# Patient Record
Sex: Male | Born: 1980 | Race: White | Hispanic: No | Marital: Single | Smoking: Never smoker
Health system: Southern US, Community
[De-identification: ages and names within clinical notes are randomized; demographics above are authoritative.]

## PROBLEM LIST (undated history)

## (undated) DIAGNOSIS — G932 Benign intracranial hypertension: Secondary | ICD-10-CM

## (undated) DIAGNOSIS — F419 Anxiety disorder, unspecified: Secondary | ICD-10-CM

## (undated) DIAGNOSIS — F32A Depression, unspecified: Secondary | ICD-10-CM

## (undated) DIAGNOSIS — J45909 Unspecified asthma, uncomplicated: Secondary | ICD-10-CM

## (undated) HISTORY — DX: Anxiety disorder, unspecified: F41.9

## (undated) HISTORY — DX: Depression, unspecified: F32.A

## (undated) HISTORY — DX: Unspecified asthma, uncomplicated: J45.909

## (undated) HISTORY — DX: Benign intracranial hypertension: G93.2

---

## 2009-02-08 DIAGNOSIS — N200 Calculus of kidney: Secondary | ICD-10-CM | POA: Insufficient documentation

## 2009-02-08 DIAGNOSIS — G932 Benign intracranial hypertension: Secondary | ICD-10-CM | POA: Insufficient documentation

## 2009-08-09 DIAGNOSIS — J45909 Unspecified asthma, uncomplicated: Secondary | ICD-10-CM | POA: Insufficient documentation

## 2012-09-01 DIAGNOSIS — J309 Allergic rhinitis, unspecified: Secondary | ICD-10-CM | POA: Insufficient documentation

## 2014-12-24 DIAGNOSIS — J3089 Other allergic rhinitis: Secondary | ICD-10-CM

## 2014-12-24 DIAGNOSIS — J4540 Moderate persistent asthma, uncomplicated: Secondary | ICD-10-CM | POA: Insufficient documentation

## 2015-01-13 ENCOUNTER — Ambulatory Visit (INDEPENDENT_AMBULATORY_CARE_PROVIDER_SITE_OTHER): Payer: BC Managed Care – PPO | Admitting: Neurology

## 2015-01-13 DIAGNOSIS — J3089 Other allergic rhinitis: Secondary | ICD-10-CM | POA: Diagnosis not present

## 2015-01-18 DIAGNOSIS — J301 Allergic rhinitis due to pollen: Secondary | ICD-10-CM | POA: Diagnosis not present

## 2015-02-03 ENCOUNTER — Ambulatory Visit (INDEPENDENT_AMBULATORY_CARE_PROVIDER_SITE_OTHER): Payer: BC Managed Care – PPO | Admitting: Neurology

## 2015-02-03 DIAGNOSIS — J309 Allergic rhinitis, unspecified: Secondary | ICD-10-CM | POA: Diagnosis not present

## 2015-02-24 ENCOUNTER — Ambulatory Visit (INDEPENDENT_AMBULATORY_CARE_PROVIDER_SITE_OTHER): Payer: BC Managed Care – PPO | Admitting: *Deleted

## 2015-02-24 DIAGNOSIS — J309 Allergic rhinitis, unspecified: Secondary | ICD-10-CM | POA: Diagnosis not present

## 2015-03-17 ENCOUNTER — Ambulatory Visit (INDEPENDENT_AMBULATORY_CARE_PROVIDER_SITE_OTHER): Payer: BC Managed Care – PPO | Admitting: *Deleted

## 2015-03-17 DIAGNOSIS — J309 Allergic rhinitis, unspecified: Secondary | ICD-10-CM | POA: Diagnosis not present

## 2015-04-21 ENCOUNTER — Ambulatory Visit (INDEPENDENT_AMBULATORY_CARE_PROVIDER_SITE_OTHER): Payer: BC Managed Care – PPO | Admitting: Neurology

## 2015-04-21 DIAGNOSIS — J309 Allergic rhinitis, unspecified: Secondary | ICD-10-CM | POA: Diagnosis not present

## 2015-05-26 ENCOUNTER — Ambulatory Visit (INDEPENDENT_AMBULATORY_CARE_PROVIDER_SITE_OTHER): Payer: BC Managed Care – PPO | Admitting: Neurology

## 2015-05-26 DIAGNOSIS — J309 Allergic rhinitis, unspecified: Secondary | ICD-10-CM | POA: Diagnosis not present

## 2015-06-19 ENCOUNTER — Ambulatory Visit (INDEPENDENT_AMBULATORY_CARE_PROVIDER_SITE_OTHER): Payer: BC Managed Care – PPO

## 2015-06-19 DIAGNOSIS — J309 Allergic rhinitis, unspecified: Secondary | ICD-10-CM

## 2015-07-14 ENCOUNTER — Ambulatory Visit (INDEPENDENT_AMBULATORY_CARE_PROVIDER_SITE_OTHER): Payer: BC Managed Care – PPO | Admitting: *Deleted

## 2015-07-14 DIAGNOSIS — J309 Allergic rhinitis, unspecified: Secondary | ICD-10-CM | POA: Diagnosis not present

## 2015-07-21 ENCOUNTER — Ambulatory Visit (INDEPENDENT_AMBULATORY_CARE_PROVIDER_SITE_OTHER): Payer: BC Managed Care – PPO | Admitting: *Deleted

## 2015-07-21 DIAGNOSIS — J309 Allergic rhinitis, unspecified: Secondary | ICD-10-CM | POA: Diagnosis not present

## 2015-08-01 ENCOUNTER — Ambulatory Visit (INDEPENDENT_AMBULATORY_CARE_PROVIDER_SITE_OTHER): Payer: BC Managed Care – PPO | Admitting: *Deleted

## 2015-08-01 DIAGNOSIS — J309 Allergic rhinitis, unspecified: Secondary | ICD-10-CM

## 2015-08-10 ENCOUNTER — Ambulatory Visit (INDEPENDENT_AMBULATORY_CARE_PROVIDER_SITE_OTHER): Payer: BC Managed Care – PPO

## 2015-08-10 DIAGNOSIS — J309 Allergic rhinitis, unspecified: Secondary | ICD-10-CM

## 2015-08-18 ENCOUNTER — Ambulatory Visit (INDEPENDENT_AMBULATORY_CARE_PROVIDER_SITE_OTHER): Payer: BC Managed Care – PPO | Admitting: *Deleted

## 2015-08-18 DIAGNOSIS — J309 Allergic rhinitis, unspecified: Secondary | ICD-10-CM | POA: Diagnosis not present

## 2015-09-15 ENCOUNTER — Ambulatory Visit (INDEPENDENT_AMBULATORY_CARE_PROVIDER_SITE_OTHER): Payer: BC Managed Care – PPO | Admitting: *Deleted

## 2015-09-15 DIAGNOSIS — J309 Allergic rhinitis, unspecified: Secondary | ICD-10-CM

## 2015-10-05 ENCOUNTER — Ambulatory Visit (INDEPENDENT_AMBULATORY_CARE_PROVIDER_SITE_OTHER): Payer: BC Managed Care – PPO

## 2015-10-05 DIAGNOSIS — J309 Allergic rhinitis, unspecified: Secondary | ICD-10-CM | POA: Diagnosis not present

## 2015-10-24 DIAGNOSIS — J301 Allergic rhinitis due to pollen: Secondary | ICD-10-CM | POA: Diagnosis not present

## 2015-10-27 ENCOUNTER — Ambulatory Visit (INDEPENDENT_AMBULATORY_CARE_PROVIDER_SITE_OTHER): Payer: BC Managed Care – PPO | Admitting: *Deleted

## 2015-10-27 DIAGNOSIS — J309 Allergic rhinitis, unspecified: Secondary | ICD-10-CM

## 2015-11-24 ENCOUNTER — Ambulatory Visit (INDEPENDENT_AMBULATORY_CARE_PROVIDER_SITE_OTHER): Payer: BC Managed Care – PPO | Admitting: *Deleted

## 2015-11-24 DIAGNOSIS — J309 Allergic rhinitis, unspecified: Secondary | ICD-10-CM | POA: Diagnosis not present

## 2015-12-21 ENCOUNTER — Other Ambulatory Visit: Payer: Self-pay | Admitting: Allergy and Immunology

## 2015-12-22 ENCOUNTER — Ambulatory Visit (INDEPENDENT_AMBULATORY_CARE_PROVIDER_SITE_OTHER): Payer: BC Managed Care – PPO

## 2015-12-22 DIAGNOSIS — J309 Allergic rhinitis, unspecified: Secondary | ICD-10-CM | POA: Diagnosis not present

## 2016-01-05 ENCOUNTER — Other Ambulatory Visit: Payer: Self-pay | Admitting: Allergy and Immunology

## 2016-01-12 ENCOUNTER — Ambulatory Visit (INDEPENDENT_AMBULATORY_CARE_PROVIDER_SITE_OTHER): Payer: BC Managed Care – PPO

## 2016-01-12 DIAGNOSIS — J309 Allergic rhinitis, unspecified: Secondary | ICD-10-CM | POA: Diagnosis not present

## 2016-01-17 ENCOUNTER — Other Ambulatory Visit: Payer: Self-pay | Admitting: Allergy and Immunology

## 2016-01-17 ENCOUNTER — Other Ambulatory Visit: Payer: Self-pay

## 2016-01-17 ENCOUNTER — Telehealth: Payer: Self-pay | Admitting: Allergy and Immunology

## 2016-01-17 MED ORDER — FLUTICASONE-SALMETEROL 100-50 MCG/DOSE IN AEPB: INHALATION_SPRAY | RESPIRATORY_TRACT | 0 refills | Status: DC

## 2016-01-17 NOTE — Telephone Encounter (Signed)
Pt called and made appointment on 01/23/2016 at 5:15 but he needs his advair refilled. [facility] [address] [phone].

## 2016-01-17 NOTE — Telephone Encounter (Signed)
Sent in 1 refill 

## 2016-01-19 ENCOUNTER — Ambulatory Visit (INDEPENDENT_AMBULATORY_CARE_PROVIDER_SITE_OTHER): Payer: BC Managed Care – PPO | Admitting: *Deleted

## 2016-01-19 DIAGNOSIS — J3089 Other allergic rhinitis: Secondary | ICD-10-CM

## 2016-01-23 ENCOUNTER — Encounter: Payer: Self-pay | Admitting: Allergy and Immunology

## 2016-01-23 ENCOUNTER — Encounter (INDEPENDENT_AMBULATORY_CARE_PROVIDER_SITE_OTHER): Payer: Self-pay

## 2016-01-23 ENCOUNTER — Ambulatory Visit (INDEPENDENT_AMBULATORY_CARE_PROVIDER_SITE_OTHER): Payer: BC Managed Care – PPO | Admitting: Allergy and Immunology

## 2016-01-23 VITALS — BP 122/72 | HR 80 | Resp 18 | Ht 63.5 in | Wt 165.6 lb

## 2016-01-23 DIAGNOSIS — H1010 Acute atopic conjunctivitis, unspecified eye: Secondary | ICD-10-CM

## 2016-01-23 DIAGNOSIS — J4540 Moderate persistent asthma, uncomplicated: Secondary | ICD-10-CM

## 2016-01-23 DIAGNOSIS — J309 Allergic rhinitis, unspecified: Secondary | ICD-10-CM

## 2016-01-23 MED ORDER — FLUTICASONE-SALMETEROL 100-50 MCG/DOSE IN AEPB: INHALATION_SPRAY | RESPIRATORY_TRACT | 5 refills | Status: DC

## 2016-01-23 MED ORDER — EPINEPHRINE 0.3 MG/0.3ML IJ SOAJ: 0.3000 mg | Freq: Once | INTRAMUSCULAR | 0 refills | Status: AC

## 2016-01-23 NOTE — Progress Notes (Signed)
Follow-up Note  Referring Provider: Deatra JamesSun, Vyvyan, MD Primary Provider: Leanor RubensteinSUN,VYVYAN Y, MD Date of Office Visit: 01/23/2016  Subjective:   David May (DOB: 08/03/1980) is a 35 y.o. male who [facility] in re-evaluation of the following:  HPI: David May returns to this clinic in evaluation of his asthma and allergic rhinoconjunctivitis treated with immunotherapy. It is been over 1 year since I've seen him in this clinic.  He appears to have done very well over the course of the past year and has not required either a systemic steroid or an antibiotic to treat any issue with his respiratory tract. He can exercise without any difficulty and rarely uses a short acting bronchodilator.  His immunotherapy has been going quite well presently at every 3 weeks. He has not had an adverse effects secondary to this form of treatment. This form of treatment has definitely given rise to very good control of all of his atopic respiratory symptoms.  David May has already receiving the flu vaccine this year    Medication List      acetaZOLAMIDE 250 MG tablet Commonly known as:  DIAMOX Take 250 mg by mouth daily.   acyclovir 200 MG capsule Commonly known as:  ZOVIRAX Take 200 mg by mouth as needed.   cetirizine 10 MG tablet Commonly known as:  ZYRTEC Take 10 mg by mouth as needed for allergies.   EPIPEN 2-PAK 0.3 mg/0.3 mL Soaj injection Generic drug:  EPINEPHrine USE AS DIRECTED FOR LIFE THREATENING ALLERGIC REACTIONS...DISPENSE 2 2PAKS 4 TOTAL PENS   FLONASE 50 MCG/ACT nasal spray Generic drug:  fluticasone Place 2 sprays into both nostrils daily.   Fluticasone-Salmeterol 100-50 MCG/DOSE Aepb Commonly known as:  ADVAIR DISKUS USE ONE INHALATION EVERY 12 HOURS TO PREVENT COUGH OR WHEEZE.   lansoprazole 30 MG capsule Commonly known as:  PREVACID Take 30 mg by mouth daily.   PROAIR HFA 108 (90 Base) MCG/ACT inhaler Generic drug:   albuterol Inhale 2 puffs into the lungs every 4 (four) hours as needed for wheezing or shortness of breath.       Past Medical History:  Diagnosis Date  . Asthma   . Pseudotumor cerebri     History reviewed. No pertinent surgical history.  Allergies  Allergen Reactions  . Codeine Itching    Review of systems negative except as noted in HPI / PMHx or noted below:  Review of Systems  Constitutional: Negative.   HENT: Negative.   Eyes: Negative.   Respiratory: Negative.   Cardiovascular: Negative.   Gastrointestinal: Negative.   Genitourinary: Negative.   Musculoskeletal: Negative.   Skin: Negative.   Neurological: Negative.   Endo/Heme/Allergies: Negative.   Psychiatric/Behavioral: Negative.      Objective:   Vitals:   01/23/16 1719  BP: 122/72  Pulse: 80  Resp: 18   Height: 5' 3.5" (161.3 cm)  Weight: 165 lb 9.6 oz (75.1 kg)   Physical Exam  Constitutional: He is well-developed, well-nourished, and in no distress.  HENT:  Head: Normocephalic.  Right Ear: Tympanic membrane, external ear and ear canal normal.  Left Ear: Tympanic membrane, external ear and ear canal normal.  Nose: Nose normal. No mucosal edema or rhinorrhea.  Mouth/Throat: Uvula is midline, oropharynx is clear and moist and mucous membranes are normal. No oropharyngeal exudate.  Eyes: Conjunctivae are normal.  Neck: Trachea normal. No tracheal tenderness present. No tracheal deviation present. No thyromegaly present.  Cardiovascular: Normal rate, regular rhythm, S1  normal, S2 normal and normal heart sounds.   No murmur heard. Pulmonary/Chest: Breath sounds normal. No stridor. No respiratory distress. He has no wheezes. He has no rales.  Musculoskeletal: He exhibits no edema.  Lymphadenopathy:       Head (right side): No tonsillar adenopathy present.       Head (left side): No tonsillar adenopathy present.    He has no cervical adenopathy.  Neurological: He is alert. Gait normal.  Skin:  No rash noted. He is not diaphoretic. No erythema. Nails show no clubbing.  Psychiatric: Mood and affect normal.    Diagnostics:    Spirometry was performed and demonstrated an FEV1 of 3.45 at 111 % of predicted.  The patient had an Asthma Control Test with the following results:  .    Assessment and Plan:   1. Asthma, moderate persistent, well-controlled   2. Allergic rhinoconjunctivitis     1. Continue immunotherapy and EpiPen  2. Use Advair 100 one inhalation one or 2 times per day depending on disease activity  3. Use Flonase one spray each nostril one or 2 times per day depending on disease activity  4. Continue antihistamine if needed  5. Continue ProAir HFA if needed  6. Return to clinic in 1 year or earlier if problem  We will now see if Shae can decrease his medical therapy by having him use 50% of his Advair and Flonase from his previous dose. He will continue on immunotherapy and we will see him back in this clinic in 1 year or earlier if there is a problem.  Laurette Schimke, MD [facility]

## 2016-01-23 NOTE — Patient Instructions (Addendum)
  1. Continue immunotherapy and EpiPen  2. Use Advair 100 one inhalation one or 2 times per day depending on disease activity  3. Use Flonase one spray each nostril one or 2 times per day depending on disease activity  4. Continue antihistamine if needed  5. Continue ProAir HFA if needed  6. Return to clinic in 1 year or earlier if problem

## 2016-01-30 DIAGNOSIS — M222X1 Patellofemoral disorders, right knee: Secondary | ICD-10-CM | POA: Insufficient documentation

## 2016-02-02 ENCOUNTER — Ambulatory Visit (INDEPENDENT_AMBULATORY_CARE_PROVIDER_SITE_OTHER): Payer: BC Managed Care – PPO

## 2016-02-02 DIAGNOSIS — J309 Allergic rhinitis, unspecified: Secondary | ICD-10-CM | POA: Diagnosis not present

## 2016-02-09 ENCOUNTER — Ambulatory Visit (INDEPENDENT_AMBULATORY_CARE_PROVIDER_SITE_OTHER): Payer: BC Managed Care – PPO

## 2016-02-09 DIAGNOSIS — J309 Allergic rhinitis, unspecified: Secondary | ICD-10-CM | POA: Diagnosis not present

## 2016-02-16 ENCOUNTER — Ambulatory Visit (INDEPENDENT_AMBULATORY_CARE_PROVIDER_SITE_OTHER): Payer: BC Managed Care – PPO

## 2016-02-16 DIAGNOSIS — J309 Allergic rhinitis, unspecified: Secondary | ICD-10-CM

## 2016-02-23 ENCOUNTER — Ambulatory Visit: Payer: Self-pay

## 2016-02-23 DIAGNOSIS — J309 Allergic rhinitis, unspecified: Secondary | ICD-10-CM

## 2016-02-27 ENCOUNTER — Other Ambulatory Visit: Payer: Self-pay | Admitting: Allergy and Immunology

## 2016-03-15 ENCOUNTER — Ambulatory Visit (INDEPENDENT_AMBULATORY_CARE_PROVIDER_SITE_OTHER): Payer: BC Managed Care – PPO | Admitting: *Deleted

## 2016-03-15 DIAGNOSIS — J309 Allergic rhinitis, unspecified: Secondary | ICD-10-CM | POA: Diagnosis not present

## 2016-04-19 ENCOUNTER — Ambulatory Visit (INDEPENDENT_AMBULATORY_CARE_PROVIDER_SITE_OTHER): Payer: BC Managed Care – PPO

## 2016-04-19 DIAGNOSIS — J309 Allergic rhinitis, unspecified: Secondary | ICD-10-CM

## 2016-05-09 NOTE — Addendum Note (Signed)
Addended by: Rosana FretWHITAKER, Rosabelle Jupin L on: 05/09/2016 11:00 AM   Modules accepted: Orders

## 2016-05-17 ENCOUNTER — Ambulatory Visit (INDEPENDENT_AMBULATORY_CARE_PROVIDER_SITE_OTHER): Payer: BC Managed Care – PPO

## 2016-05-17 DIAGNOSIS — J309 Allergic rhinitis, unspecified: Secondary | ICD-10-CM

## 2016-06-20 DIAGNOSIS — J301 Allergic rhinitis due to pollen: Secondary | ICD-10-CM | POA: Diagnosis not present

## 2016-06-28 ENCOUNTER — Ambulatory Visit (INDEPENDENT_AMBULATORY_CARE_PROVIDER_SITE_OTHER): Payer: BC Managed Care – PPO | Admitting: *Deleted

## 2016-06-28 DIAGNOSIS — J309 Allergic rhinitis, unspecified: Secondary | ICD-10-CM | POA: Diagnosis not present

## 2016-08-23 ENCOUNTER — Ambulatory Visit (INDEPENDENT_AMBULATORY_CARE_PROVIDER_SITE_OTHER): Payer: BC Managed Care – PPO | Admitting: *Deleted

## 2016-08-23 DIAGNOSIS — J309 Allergic rhinitis, unspecified: Secondary | ICD-10-CM

## 2016-08-30 ENCOUNTER — Ambulatory Visit (INDEPENDENT_AMBULATORY_CARE_PROVIDER_SITE_OTHER): Payer: BC Managed Care – PPO

## 2016-08-30 DIAGNOSIS — J309 Allergic rhinitis, unspecified: Secondary | ICD-10-CM | POA: Diagnosis not present

## 2016-09-06 ENCOUNTER — Ambulatory Visit (INDEPENDENT_AMBULATORY_CARE_PROVIDER_SITE_OTHER): Payer: BC Managed Care – PPO

## 2016-09-06 DIAGNOSIS — J309 Allergic rhinitis, unspecified: Secondary | ICD-10-CM

## 2016-09-20 ENCOUNTER — Ambulatory Visit (INDEPENDENT_AMBULATORY_CARE_PROVIDER_SITE_OTHER): Payer: BC Managed Care – PPO | Admitting: *Deleted

## 2016-09-20 DIAGNOSIS — J309 Allergic rhinitis, unspecified: Secondary | ICD-10-CM

## 2016-09-27 ENCOUNTER — Ambulatory Visit (INDEPENDENT_AMBULATORY_CARE_PROVIDER_SITE_OTHER): Payer: BC Managed Care – PPO

## 2016-09-27 DIAGNOSIS — J309 Allergic rhinitis, unspecified: Secondary | ICD-10-CM | POA: Diagnosis not present

## 2016-10-04 ENCOUNTER — Ambulatory Visit (INDEPENDENT_AMBULATORY_CARE_PROVIDER_SITE_OTHER): Payer: BC Managed Care – PPO

## 2016-10-04 DIAGNOSIS — J309 Allergic rhinitis, unspecified: Secondary | ICD-10-CM

## 2016-10-10 ENCOUNTER — Ambulatory Visit (INDEPENDENT_AMBULATORY_CARE_PROVIDER_SITE_OTHER): Payer: BC Managed Care – PPO | Admitting: *Deleted

## 2016-10-10 DIAGNOSIS — J309 Allergic rhinitis, unspecified: Secondary | ICD-10-CM

## 2016-10-24 ENCOUNTER — Ambulatory Visit (INDEPENDENT_AMBULATORY_CARE_PROVIDER_SITE_OTHER): Payer: BC Managed Care – PPO | Admitting: *Deleted

## 2016-10-24 DIAGNOSIS — J309 Allergic rhinitis, unspecified: Secondary | ICD-10-CM | POA: Diagnosis not present

## 2016-11-15 ENCOUNTER — Ambulatory Visit (INDEPENDENT_AMBULATORY_CARE_PROVIDER_SITE_OTHER): Payer: BC Managed Care – PPO

## 2016-11-15 DIAGNOSIS — J309 Allergic rhinitis, unspecified: Secondary | ICD-10-CM

## 2016-12-13 ENCOUNTER — Ambulatory Visit (INDEPENDENT_AMBULATORY_CARE_PROVIDER_SITE_OTHER): Payer: BC Managed Care – PPO

## 2016-12-13 DIAGNOSIS — J309 Allergic rhinitis, unspecified: Secondary | ICD-10-CM

## 2017-01-03 ENCOUNTER — Ambulatory Visit (INDEPENDENT_AMBULATORY_CARE_PROVIDER_SITE_OTHER): Payer: BC Managed Care – PPO

## 2017-01-03 DIAGNOSIS — J309 Allergic rhinitis, unspecified: Secondary | ICD-10-CM

## 2017-01-17 DIAGNOSIS — J3089 Other allergic rhinitis: Secondary | ICD-10-CM | POA: Diagnosis not present

## 2017-01-28 ENCOUNTER — Ambulatory Visit (INDEPENDENT_AMBULATORY_CARE_PROVIDER_SITE_OTHER): Payer: BC Managed Care – PPO | Admitting: *Deleted

## 2017-01-28 DIAGNOSIS — J309 Allergic rhinitis, unspecified: Secondary | ICD-10-CM

## 2017-02-21 ENCOUNTER — Ambulatory Visit (INDEPENDENT_AMBULATORY_CARE_PROVIDER_SITE_OTHER): Payer: BC Managed Care – PPO

## 2017-02-21 DIAGNOSIS — J309 Allergic rhinitis, unspecified: Secondary | ICD-10-CM | POA: Diagnosis not present

## 2017-03-14 ENCOUNTER — Ambulatory Visit (INDEPENDENT_AMBULATORY_CARE_PROVIDER_SITE_OTHER): Payer: BC Managed Care – PPO

## 2017-03-14 DIAGNOSIS — J309 Allergic rhinitis, unspecified: Secondary | ICD-10-CM

## 2017-03-15 ENCOUNTER — Other Ambulatory Visit: Payer: Self-pay | Admitting: Allergy and Immunology

## 2017-03-18 ENCOUNTER — Other Ambulatory Visit: Payer: Self-pay

## 2017-03-18 ENCOUNTER — Encounter: Payer: Self-pay | Admitting: Allergy and Immunology

## 2017-03-18 ENCOUNTER — Ambulatory Visit: Payer: BC Managed Care – PPO | Admitting: Allergy and Immunology

## 2017-03-18 VITALS — BP 130/88 | HR 72 | Resp 20

## 2017-03-18 DIAGNOSIS — J4530 Mild persistent asthma, uncomplicated: Secondary | ICD-10-CM | POA: Diagnosis not present

## 2017-03-18 DIAGNOSIS — J3089 Other allergic rhinitis: Secondary | ICD-10-CM | POA: Diagnosis not present

## 2017-03-18 MED ORDER — FLUTICASONE-SALMETEROL 100-50 MCG/DOSE IN AEPB: INHALATION_SPRAY | RESPIRATORY_TRACT | 5 refills | Status: DC

## 2017-03-18 MED ORDER — EPINEPHRINE 0.3 MG/0.3ML IJ SOAJ: 0.3000 mg | Freq: Once | INTRAMUSCULAR | 3 refills | Status: AC

## 2017-03-18 MED ORDER — ALBUTEROL SULFATE HFA 108 (90 BASE) MCG/ACT IN AERS: 2.0000 | INHALATION_SPRAY | RESPIRATORY_TRACT | 3 refills | Status: DC | PRN

## 2017-03-18 NOTE — Progress Notes (Signed)
Follow-up Note  Referring Provider: Deatra JamesSun, Vyvyan, MD Primary Provider: Deatra JamesSun, Vyvyan, MD Date of Office Visit: 03/18/2017  Subjective:   David May (DOB: 06/04/1980) is a 36 y.o. male who [facility] 03[facility]/2017 in re-evaluation of the following:  HPI: David May presents to this clinic in reevaluation of his asthma and allergic rhinoconjunctivitis.  He was last seen in this clinic 23 January 2016.  He is utilizing a course of immunotherapy presently at every 3 weeks and has done very well with this form of treatment.  He has not had an adverse effect secondary to this treatment.  He has had no issues with asthma and presently does has not used Advair the past week.  He was using 1 inhalation 1 time per day prior to that point.  Rarely does he use a short acting bronchodilator.  He has had no problems with his nose and intermittently uses Flonase.  He has not required either a systemic steroid or an antibiotic to treat any type of respiratory tract issue.  He did receive the flu vaccine this year.  Allergies as of 03/18/2017      Reactions   Codeine Itching   Hydrocodone-acetaminophen Rash      Medication List      acyclovir 200 MG capsule Commonly known as:  ZOVIRAX Take 200 mg by mouth as needed.   cetirizine 10 MG tablet Commonly known as:  ZYRTEC Take 10 mg by mouth as needed for allergies.   fluticasone 50 MCG/ACT nasal spray Commonly known as:  FLONASE USE 1-2 SPRAYS EACH NOSTRIL ONCE DAILY FOR STUFFY NOSE OR DRAINAGE   Fluticasone-Salmeterol 100-50 MCG/DOSE Aepb Commonly known as:  ADVAIR DISKUS USE ONE INHALATION EVERY 12 HOURS TO PREVENT COUGH OR WHEEZE.   lansoprazole 30 MG capsule Commonly known as:  PREVACID Take 30 mg by mouth daily.   PROAIR HFA 108 (90 Base) MCG/ACT inhaler Generic drug:  albuterol Inhale 2 puffs into the lungs every 4 (four) hours as needed for wheezing or shortness of breath.       Past Medical  History:  Diagnosis Date  . Asthma   . Pseudotumor cerebri     History reviewed. No pertinent surgical history.  Review of systems negative except as noted in HPI / PMHx or noted below:  Review of Systems  Constitutional: Negative.   HENT: Negative.   Eyes: Negative.   Respiratory: Negative.   Cardiovascular: Negative.   Gastrointestinal: Negative.   Genitourinary: Negative.   Musculoskeletal: Negative.   Skin: Negative.   Neurological: Negative.   Endo/Heme/Allergies: Negative.   Psychiatric/Behavioral: Negative.      Objective:   Vitals:   03/18/17 1605  BP: 130/88  Pulse: 72  Resp: 20          Physical Exam  Constitutional: He is well-developed, well-nourished, and in no distress.  HENT:  Head: Normocephalic.  Right Ear: Tympanic membrane, external ear and ear canal normal.  Left Ear: Tympanic membrane, external ear and ear canal normal.  Nose: Nose normal. No mucosal edema or rhinorrhea.  Mouth/Throat: Uvula is midline, oropharynx is clear and moist and mucous membranes are normal. No oropharyngeal exudate.  Eyes: Conjunctivae are normal.  Neck: Trachea normal. No tracheal tenderness present. No tracheal deviation present. No thyromegaly present.  Cardiovascular: Normal rate, regular rhythm, S1 normal, S2 normal and normal heart sounds.  No murmur heard. Pulmonary/Chest: Breath sounds normal. No stridor. No respiratory distress. He has no wheezes.  He has no rales.  Musculoskeletal: He exhibits no edema.  Lymphadenopathy:       Head (right side): No tonsillar adenopathy present.       Head (left side): No tonsillar adenopathy present.    He has no cervical adenopathy.  Neurological: He is alert. Gait normal.  Skin: No rash noted. He is not diaphoretic. No erythema. Nails show no clubbing.  Psychiatric: Mood and affect normal.    Diagnostics:    Spirometry was performed and demonstrated an FEV1 of 3.64 at 101 % of predicted.  The patient had an Asthma  Control Test with the following results: ACT Total Score: 25.    Assessment and Plan:   1. Asthma, well controlled, mild persistent   2. Other allergic rhinitis     1. Continue immunotherapy and EpiPen  2. Discontinue daily use of Advair 100: can restart at 1 inhalation two times a day if increased asthma activity.  3. Use Flonase one spray each nostril 1 or 2 times per day depending on disease activity  4. Continue antihistamine if needed  5. Continue ProAir HFA if needed  6. Return to clinic in 1 year or earlier if problem  David May appears to be doing quite well on his current plan which includes immunotherapy and some occasional anti-inflammatory agents for his respiratory tract.  He will continue on this form of treatment and I will see him back in this clinic in 1 year or earlier if there is a problem.  Laurette SchimkeEric Pelagia Iacobucci, MD Allergy / Immunology [facility]

## 2017-03-18 NOTE — Patient Instructions (Addendum)
  1. Continue immunotherapy and Auvi-Q  2. Discontinue daily use of Advair 100: can restart at 1 inhalation two times a day if increased asthma activity.  3. Use Flonase one spray each nostril 1 or 2 times per day depending on disease activity  4. Continue antihistamine if needed  5. Continue ProAir HFA if needed  6. Return to clinic in 1 year or earlier if problem

## 2017-03-19 ENCOUNTER — Encounter: Payer: Self-pay | Admitting: Allergy and Immunology

## 2017-04-18 ENCOUNTER — Ambulatory Visit: Payer: BC Managed Care – PPO

## 2017-04-18 ENCOUNTER — Ambulatory Visit (INDEPENDENT_AMBULATORY_CARE_PROVIDER_SITE_OTHER): Payer: BC Managed Care – PPO | Admitting: *Deleted

## 2017-04-18 DIAGNOSIS — J309 Allergic rhinitis, unspecified: Secondary | ICD-10-CM

## 2017-04-25 ENCOUNTER — Ambulatory Visit (INDEPENDENT_AMBULATORY_CARE_PROVIDER_SITE_OTHER): Payer: BC Managed Care – PPO

## 2017-04-25 DIAGNOSIS — J309 Allergic rhinitis, unspecified: Secondary | ICD-10-CM

## 2017-05-02 ENCOUNTER — Ambulatory Visit (INDEPENDENT_AMBULATORY_CARE_PROVIDER_SITE_OTHER): Payer: BC Managed Care – PPO

## 2017-05-02 DIAGNOSIS — J309 Allergic rhinitis, unspecified: Secondary | ICD-10-CM | POA: Diagnosis not present

## 2017-05-09 ENCOUNTER — Ambulatory Visit (INDEPENDENT_AMBULATORY_CARE_PROVIDER_SITE_OTHER): Payer: BC Managed Care – PPO

## 2017-05-09 DIAGNOSIS — J309 Allergic rhinitis, unspecified: Secondary | ICD-10-CM

## 2017-05-15 ENCOUNTER — Ambulatory Visit (INDEPENDENT_AMBULATORY_CARE_PROVIDER_SITE_OTHER): Payer: BC Managed Care – PPO | Admitting: *Deleted

## 2017-05-15 DIAGNOSIS — J309 Allergic rhinitis, unspecified: Secondary | ICD-10-CM

## 2017-05-23 ENCOUNTER — Other Ambulatory Visit: Payer: Self-pay | Admitting: Allergy and Immunology

## 2017-06-13 ENCOUNTER — Ambulatory Visit (INDEPENDENT_AMBULATORY_CARE_PROVIDER_SITE_OTHER): Payer: BC Managed Care – PPO | Admitting: *Deleted

## 2017-06-13 DIAGNOSIS — J309 Allergic rhinitis, unspecified: Secondary | ICD-10-CM | POA: Diagnosis not present

## 2017-07-04 ENCOUNTER — Ambulatory Visit (INDEPENDENT_AMBULATORY_CARE_PROVIDER_SITE_OTHER): Payer: BC Managed Care – PPO

## 2017-07-04 DIAGNOSIS — J309 Allergic rhinitis, unspecified: Secondary | ICD-10-CM

## 2017-08-07 ENCOUNTER — Ambulatory Visit (INDEPENDENT_AMBULATORY_CARE_PROVIDER_SITE_OTHER): Payer: BC Managed Care – PPO | Admitting: *Deleted

## 2017-08-07 DIAGNOSIS — J309 Allergic rhinitis, unspecified: Secondary | ICD-10-CM | POA: Diagnosis not present

## 2017-09-05 ENCOUNTER — Ambulatory Visit (INDEPENDENT_AMBULATORY_CARE_PROVIDER_SITE_OTHER): Payer: BC Managed Care – PPO

## 2017-09-05 DIAGNOSIS — J309 Allergic rhinitis, unspecified: Secondary | ICD-10-CM

## 2017-09-11 NOTE — Progress Notes (Signed)
VIAL EXP 09-13-18 

## 2017-09-12 DIAGNOSIS — J3089 Other allergic rhinitis: Secondary | ICD-10-CM | POA: Diagnosis not present

## 2017-10-03 ENCOUNTER — Ambulatory Visit (INDEPENDENT_AMBULATORY_CARE_PROVIDER_SITE_OTHER): Payer: BC Managed Care – PPO

## 2017-10-03 DIAGNOSIS — J309 Allergic rhinitis, unspecified: Secondary | ICD-10-CM | POA: Diagnosis not present

## 2017-10-30 ENCOUNTER — Ambulatory Visit (INDEPENDENT_AMBULATORY_CARE_PROVIDER_SITE_OTHER): Payer: BC Managed Care – PPO | Admitting: *Deleted

## 2017-10-30 DIAGNOSIS — J309 Allergic rhinitis, unspecified: Secondary | ICD-10-CM

## 2017-11-07 ENCOUNTER — Ambulatory Visit (INDEPENDENT_AMBULATORY_CARE_PROVIDER_SITE_OTHER): Payer: BC Managed Care – PPO

## 2017-11-07 DIAGNOSIS — J309 Allergic rhinitis, unspecified: Secondary | ICD-10-CM | POA: Diagnosis not present

## 2017-11-13 ENCOUNTER — Ambulatory Visit (INDEPENDENT_AMBULATORY_CARE_PROVIDER_SITE_OTHER): Payer: BC Managed Care – PPO | Admitting: *Deleted

## 2017-11-13 DIAGNOSIS — J309 Allergic rhinitis, unspecified: Secondary | ICD-10-CM

## 2017-11-20 ENCOUNTER — Ambulatory Visit (INDEPENDENT_AMBULATORY_CARE_PROVIDER_SITE_OTHER): Payer: BC Managed Care – PPO | Admitting: *Deleted

## 2017-11-20 DIAGNOSIS — J309 Allergic rhinitis, unspecified: Secondary | ICD-10-CM | POA: Diagnosis not present

## 2017-11-27 ENCOUNTER — Ambulatory Visit (INDEPENDENT_AMBULATORY_CARE_PROVIDER_SITE_OTHER): Payer: BC Managed Care – PPO | Admitting: *Deleted

## 2017-11-27 DIAGNOSIS — J309 Allergic rhinitis, unspecified: Secondary | ICD-10-CM | POA: Diagnosis not present

## 2017-12-19 ENCOUNTER — Ambulatory Visit (INDEPENDENT_AMBULATORY_CARE_PROVIDER_SITE_OTHER): Payer: BC Managed Care – PPO

## 2017-12-19 DIAGNOSIS — J309 Allergic rhinitis, unspecified: Secondary | ICD-10-CM

## 2018-01-09 ENCOUNTER — Ambulatory Visit (INDEPENDENT_AMBULATORY_CARE_PROVIDER_SITE_OTHER): Payer: BC Managed Care – PPO

## 2018-01-09 DIAGNOSIS — J309 Allergic rhinitis, unspecified: Secondary | ICD-10-CM | POA: Diagnosis not present

## 2018-02-06 ENCOUNTER — Ambulatory Visit (INDEPENDENT_AMBULATORY_CARE_PROVIDER_SITE_OTHER): Payer: BC Managed Care – PPO

## 2018-02-06 DIAGNOSIS — J309 Allergic rhinitis, unspecified: Secondary | ICD-10-CM

## 2018-02-10 DIAGNOSIS — J3089 Other allergic rhinitis: Secondary | ICD-10-CM

## 2018-02-10 NOTE — Progress Notes (Signed)
Reprinted label

## 2018-02-10 NOTE — Progress Notes (Signed)
Vial exp 02-11-19 

## 2018-02-27 ENCOUNTER — Ambulatory Visit (INDEPENDENT_AMBULATORY_CARE_PROVIDER_SITE_OTHER): Payer: BC Managed Care – PPO | Admitting: *Deleted

## 2018-02-27 DIAGNOSIS — J309 Allergic rhinitis, unspecified: Secondary | ICD-10-CM

## 2018-03-20 ENCOUNTER — Ambulatory Visit (INDEPENDENT_AMBULATORY_CARE_PROVIDER_SITE_OTHER): Payer: BC Managed Care – PPO | Admitting: *Deleted

## 2018-03-20 DIAGNOSIS — J309 Allergic rhinitis, unspecified: Secondary | ICD-10-CM

## 2018-04-10 ENCOUNTER — Ambulatory Visit (INDEPENDENT_AMBULATORY_CARE_PROVIDER_SITE_OTHER): Payer: BC Managed Care – PPO

## 2018-04-10 DIAGNOSIS — J309 Allergic rhinitis, unspecified: Secondary | ICD-10-CM

## 2018-05-01 ENCOUNTER — Ambulatory Visit (INDEPENDENT_AMBULATORY_CARE_PROVIDER_SITE_OTHER): Payer: BC Managed Care – PPO | Admitting: *Deleted

## 2018-05-01 DIAGNOSIS — J309 Allergic rhinitis, unspecified: Secondary | ICD-10-CM | POA: Diagnosis not present

## 2018-05-07 ENCOUNTER — Ambulatory Visit (INDEPENDENT_AMBULATORY_CARE_PROVIDER_SITE_OTHER): Payer: BC Managed Care – PPO | Admitting: *Deleted

## 2018-05-07 DIAGNOSIS — J309 Allergic rhinitis, unspecified: Secondary | ICD-10-CM

## 2018-05-14 ENCOUNTER — Ambulatory Visit (INDEPENDENT_AMBULATORY_CARE_PROVIDER_SITE_OTHER): Payer: BC Managed Care – PPO | Admitting: *Deleted

## 2018-05-14 DIAGNOSIS — J309 Allergic rhinitis, unspecified: Secondary | ICD-10-CM | POA: Diagnosis not present

## 2018-05-21 ENCOUNTER — Ambulatory Visit (INDEPENDENT_AMBULATORY_CARE_PROVIDER_SITE_OTHER): Payer: BC Managed Care – PPO

## 2018-05-21 DIAGNOSIS — J309 Allergic rhinitis, unspecified: Secondary | ICD-10-CM

## 2018-05-28 ENCOUNTER — Ambulatory Visit (INDEPENDENT_AMBULATORY_CARE_PROVIDER_SITE_OTHER): Payer: BC Managed Care – PPO | Admitting: *Deleted

## 2018-05-28 DIAGNOSIS — J309 Allergic rhinitis, unspecified: Secondary | ICD-10-CM

## 2018-06-19 ENCOUNTER — Ambulatory Visit (INDEPENDENT_AMBULATORY_CARE_PROVIDER_SITE_OTHER): Payer: BC Managed Care – PPO | Admitting: *Deleted

## 2018-06-19 DIAGNOSIS — J309 Allergic rhinitis, unspecified: Secondary | ICD-10-CM

## 2018-07-08 ENCOUNTER — Telehealth: Payer: Self-pay | Admitting: Allergy and Immunology

## 2018-07-08 MED ORDER — EPINEPHRINE 0.3 MG/0.3ML IJ SOAJ: 0.3000 mg | Freq: Once | INTRAMUSCULAR | 2 refills | Status: DC | PRN

## 2018-07-08 NOTE — Telephone Encounter (Signed)
Patient needs a refill on EPI-PEN sent to the [facility] [address]

## 2018-07-08 NOTE — Telephone Encounter (Signed)
Medication refilled.  Patient will need an OV.

## 2018-07-10 ENCOUNTER — Ambulatory Visit (INDEPENDENT_AMBULATORY_CARE_PROVIDER_SITE_OTHER): Payer: BC Managed Care – PPO | Admitting: *Deleted

## 2018-07-10 ENCOUNTER — Other Ambulatory Visit: Payer: Self-pay | Admitting: *Deleted

## 2018-07-10 DIAGNOSIS — J309 Allergic rhinitis, unspecified: Secondary | ICD-10-CM

## 2018-07-10 MED ORDER — EPINEPHRINE 0.3 MG/0.3ML IJ SOAJ: 0.3000 mg | Freq: Once | INTRAMUSCULAR | 2 refills | Status: DC | PRN

## 2018-07-31 ENCOUNTER — Ambulatory Visit (INDEPENDENT_AMBULATORY_CARE_PROVIDER_SITE_OTHER): Payer: BC Managed Care – PPO | Admitting: *Deleted

## 2018-07-31 DIAGNOSIS — J309 Allergic rhinitis, unspecified: Secondary | ICD-10-CM

## 2018-08-21 ENCOUNTER — Ambulatory Visit (INDEPENDENT_AMBULATORY_CARE_PROVIDER_SITE_OTHER): Payer: BC Managed Care – PPO | Admitting: *Deleted

## 2018-08-21 DIAGNOSIS — J309 Allergic rhinitis, unspecified: Secondary | ICD-10-CM | POA: Diagnosis not present

## 2018-08-26 NOTE — Progress Notes (Signed)
VIAL EXP 08-26-2019 

## 2018-08-27 DIAGNOSIS — J3089 Other allergic rhinitis: Secondary | ICD-10-CM | POA: Diagnosis not present

## 2018-09-10 ENCOUNTER — Ambulatory Visit (INDEPENDENT_AMBULATORY_CARE_PROVIDER_SITE_OTHER): Payer: BC Managed Care – PPO

## 2018-09-10 DIAGNOSIS — J309 Allergic rhinitis, unspecified: Secondary | ICD-10-CM

## 2018-10-01 ENCOUNTER — Ambulatory Visit (INDEPENDENT_AMBULATORY_CARE_PROVIDER_SITE_OTHER): Payer: BC Managed Care – PPO

## 2018-10-01 DIAGNOSIS — J309 Allergic rhinitis, unspecified: Secondary | ICD-10-CM | POA: Diagnosis not present

## 2018-10-09 ENCOUNTER — Other Ambulatory Visit: Payer: Self-pay | Admitting: Allergy and Immunology

## 2018-10-09 MED ORDER — FLUTICASONE PROPIONATE 50 MCG/ACT NA SUSP: 2.0000 | Freq: Every day | NASAL | 1 refills | Status: DC

## 2018-10-09 NOTE — Telephone Encounter (Signed)
Patient has an upcoming appt in July Can a courtesy refill be sent in until patient is seen?? [facility] [address]

## 2018-10-09 NOTE — Telephone Encounter (Signed)
Refill sent.

## 2018-10-27 ENCOUNTER — Other Ambulatory Visit: Payer: Self-pay

## 2018-10-27 ENCOUNTER — Encounter: Payer: Self-pay | Admitting: Allergy and Immunology

## 2018-10-27 ENCOUNTER — Ambulatory Visit: Payer: BC Managed Care – PPO | Admitting: Allergy and Immunology

## 2018-10-27 VITALS — BP 128/80 | HR 64 | Temp 98.3°F | Resp 16 | Ht 64.0 in

## 2018-10-27 DIAGNOSIS — J4530 Mild persistent asthma, uncomplicated: Secondary | ICD-10-CM

## 2018-10-27 DIAGNOSIS — J3089 Other allergic rhinitis: Secondary | ICD-10-CM | POA: Diagnosis not present

## 2018-10-27 NOTE — Progress Notes (Signed)
[facility]   Follow-up Note  Referring Provider: Donald Prose, MD Primary Provider: Donald Prose, MD Date of Office Visit: 10/27/2018  Subjective:   David May (DOB: 1980-10-07) is a 38 y.o. male who returns to the [facility] on 10/27/2018 in re-evaluation of the following:  HPI: Cortavious returns to this clinic in evaluation of asthma and allergic rhinoconjunctivitis.  He was last seen in this clinic on 18 March 2017.  Currently Leverett is utilizing a course of immunotherapy.  His frequency of administration is every 3 weeks.  He has not had any adverse effect secondary to the use of this therapy.  His asthma has been under excellent control and he rarely uses any short acting bronchodilator.  He has not required a systemic steroid to treat an exacerbation of asthma.  He no longer uses any Advair  He has had very little problems with his nose.  He has not required an antibiotic to treat an episode of sinusitis.  He intermittently uses a nasal steroid  Allergies as of 10/27/2018      Reactions   Codeine Itching   Hydrocodone-acetaminophen Rash      Medication List      acyclovir 200 MG capsule Commonly known as: ZOVIRAX Take 200 mg by mouth as needed.   cetirizine 10 MG tablet Commonly known as: ZYRTEC Take 10 mg by mouth as needed for allergies.   EPINEPHrine 0.3 mg/0.3 mL Soaj injection Commonly known as: EpiPen 2-Pak Inject 0.3 mLs (0.3 mg total) into the muscle once as needed for up to 1 dose for anaphylaxis.   fluticasone 50 MCG/ACT nasal spray Commonly known as: FLONASE Place 2 sprays into both nostrils daily.   Fluticasone-Salmeterol 100-50 MCG/DOSE Aepb Commonly known as: Advair Diskus USE ONE INHALATION EVERY 12 HOURS TO PREVENT COUGH OR WHEEZE.   lansoprazole 30 MG capsule Commonly known as: PREVACID Take 30 mg by mouth daily.   ProAir HFA 108 (90 Base) MCG/ACT inhaler Generic drug:  albuterol Inhale 2 puffs into the lungs every 4 (four) hours as needed for wheezing or shortness of breath.   albuterol 108 (90 Base) MCG/ACT inhaler Commonly known as: VENTOLIN HFA Inhale 2 puffs into the lungs every 4 (four) hours as needed for wheezing or shortness of breath.       Past Medical History:  Diagnosis Date  . Asthma   . Pseudotumor cerebri     History reviewed. No pertinent surgical history.  Review of systems negative except as noted in HPI / PMHx or noted below:  Review of Systems  Constitutional: Negative.   HENT: Negative.   Eyes: Negative.   Respiratory: Negative.   Cardiovascular: Negative.   Gastrointestinal: Negative.   Genitourinary: Negative.   Musculoskeletal: Negative.   Skin: Negative.   Neurological: Negative.   Endo/Heme/Allergies: Negative.   Psychiatric/Behavioral: Negative.      Objective:   Vitals:   10/27/18 1523  BP: 128/80  Pulse: 64  Resp: 16  Temp: 98.3 F (36.8 C)  SpO2: 98%   Height: 5\' 4"  (162.6 cm)      Physical Exam Constitutional:      Appearance: He is not diaphoretic.  HENT:     Head: Normocephalic.     Right Ear: Tympanic membrane, ear canal and external ear normal.     Left Ear: Tympanic membrane, ear canal and external ear normal.     Nose: Nose normal. No mucosal edema or rhinorrhea.  Mouth/Throat:     Pharynx: Uvula midline. No oropharyngeal exudate.  Eyes:     Conjunctiva/sclera: Conjunctivae normal.  Neck:     Thyroid: No thyromegaly.     Trachea: Trachea normal. No tracheal tenderness or tracheal deviation.  Cardiovascular:     Rate and Rhythm: Normal rate and regular rhythm.     Heart sounds: Normal heart sounds, S1 normal and S2 normal. No murmur.  Pulmonary:     Effort: No respiratory distress.     Breath sounds: Normal breath sounds. No stridor. No wheezing or rales.  Lymphadenopathy:     Head:     Right side of head: No tonsillar adenopathy.     Left side of head: No tonsillar  adenopathy.     Cervical: No cervical adenopathy.  Skin:    Findings: No erythema or rash.     Nails: There is no clubbing.   Neurological:     Mental Status: He is alert.     Diagnostics:    Spirometry was performed and demonstrated an FEV1 of 3.39 at 95 % of predicted.   Assessment and Plan:   1. Asthma, well controlled, mild persistent   2. Other allergic rhinitis     1. Continue immunotherapy and Auvi-Q  2. Continue Flonase one spray each nostril 1 or 2 times per day depending on disease activity  3. Continue antihistamine if needed  4. Continue ProAir HFA if needed  5. Return to clinic in 1 year or earlier if problem  6.  Obtain flu vaccine (and COVID vaccine)  David May appears to be doing quite well and a fair amount of his atopic immune system dysfunction has resolved along with his medication requirement with his immunotherapy.  He will continue to utilize this form of therapy and I will see him back in this clinic in 1 year or earlier if there is a problem.  Laurette SchimkeEric Kozlow, MD Allergy / Immunology [facility]

## 2018-10-27 NOTE — Patient Instructions (Addendum)
  1. Continue immunotherapy and Auvi-Q  2. Continue Flonase one spray each nostril 1 or 2 times per day depending on disease activity  3. Continue antihistamine if needed  4. Continue ProAir HFA if needed  5. Return to clinic in 1 year or earlier if problem  6.  Obtain flu vaccine (and COVID vaccine)

## 2018-10-28 ENCOUNTER — Encounter: Payer: Self-pay | Admitting: Allergy and Immunology

## 2018-11-02 ENCOUNTER — Other Ambulatory Visit: Payer: Self-pay | Admitting: Allergy and Immunology

## 2018-11-03 ENCOUNTER — Ambulatory Visit (INDEPENDENT_AMBULATORY_CARE_PROVIDER_SITE_OTHER): Payer: BC Managed Care – PPO | Admitting: *Deleted

## 2018-11-03 DIAGNOSIS — J309 Allergic rhinitis, unspecified: Secondary | ICD-10-CM | POA: Diagnosis not present

## 2018-11-10 ENCOUNTER — Ambulatory Visit (INDEPENDENT_AMBULATORY_CARE_PROVIDER_SITE_OTHER): Payer: BC Managed Care – PPO

## 2018-11-10 DIAGNOSIS — J309 Allergic rhinitis, unspecified: Secondary | ICD-10-CM | POA: Diagnosis not present

## 2018-11-17 ENCOUNTER — Ambulatory Visit (INDEPENDENT_AMBULATORY_CARE_PROVIDER_SITE_OTHER): Payer: BC Managed Care – PPO | Admitting: *Deleted

## 2018-11-17 DIAGNOSIS — J309 Allergic rhinitis, unspecified: Secondary | ICD-10-CM | POA: Diagnosis not present

## 2018-11-24 ENCOUNTER — Ambulatory Visit (INDEPENDENT_AMBULATORY_CARE_PROVIDER_SITE_OTHER): Payer: BC Managed Care – PPO | Admitting: *Deleted

## 2018-11-24 DIAGNOSIS — J309 Allergic rhinitis, unspecified: Secondary | ICD-10-CM | POA: Diagnosis not present

## 2018-12-24 ENCOUNTER — Ambulatory Visit (INDEPENDENT_AMBULATORY_CARE_PROVIDER_SITE_OTHER): Payer: BC Managed Care – PPO | Admitting: *Deleted

## 2018-12-24 DIAGNOSIS — J309 Allergic rhinitis, unspecified: Secondary | ICD-10-CM

## 2019-01-21 ENCOUNTER — Ambulatory Visit (INDEPENDENT_AMBULATORY_CARE_PROVIDER_SITE_OTHER): Payer: BC Managed Care – PPO | Admitting: *Deleted

## 2019-01-21 DIAGNOSIS — J309 Allergic rhinitis, unspecified: Secondary | ICD-10-CM | POA: Diagnosis not present

## 2019-02-18 ENCOUNTER — Ambulatory Visit (INDEPENDENT_AMBULATORY_CARE_PROVIDER_SITE_OTHER): Payer: BC Managed Care – PPO

## 2019-02-18 DIAGNOSIS — J309 Allergic rhinitis, unspecified: Secondary | ICD-10-CM

## 2019-03-18 ENCOUNTER — Ambulatory Visit (INDEPENDENT_AMBULATORY_CARE_PROVIDER_SITE_OTHER): Payer: BC Managed Care – PPO | Admitting: *Deleted

## 2019-03-18 DIAGNOSIS — J309 Allergic rhinitis, unspecified: Secondary | ICD-10-CM

## 2019-03-23 DIAGNOSIS — J3089 Other allergic rhinitis: Secondary | ICD-10-CM

## 2019-03-23 NOTE — Progress Notes (Signed)
VIAL EXP 03-22-20 

## 2019-04-13 ENCOUNTER — Ambulatory Visit (INDEPENDENT_AMBULATORY_CARE_PROVIDER_SITE_OTHER): Payer: BC Managed Care – PPO | Admitting: *Deleted

## 2019-04-13 DIAGNOSIS — J309 Allergic rhinitis, unspecified: Secondary | ICD-10-CM | POA: Diagnosis not present

## 2019-05-11 ENCOUNTER — Ambulatory Visit (INDEPENDENT_AMBULATORY_CARE_PROVIDER_SITE_OTHER): Payer: BC Managed Care – PPO

## 2019-05-11 DIAGNOSIS — J309 Allergic rhinitis, unspecified: Secondary | ICD-10-CM | POA: Diagnosis not present

## 2019-06-08 ENCOUNTER — Ambulatory Visit (INDEPENDENT_AMBULATORY_CARE_PROVIDER_SITE_OTHER): Payer: BC Managed Care – PPO

## 2019-06-08 DIAGNOSIS — J309 Allergic rhinitis, unspecified: Secondary | ICD-10-CM | POA: Diagnosis not present

## 2019-06-15 ENCOUNTER — Ambulatory Visit (INDEPENDENT_AMBULATORY_CARE_PROVIDER_SITE_OTHER): Payer: BC Managed Care – PPO

## 2019-06-15 DIAGNOSIS — J309 Allergic rhinitis, unspecified: Secondary | ICD-10-CM

## 2019-06-22 ENCOUNTER — Ambulatory Visit (INDEPENDENT_AMBULATORY_CARE_PROVIDER_SITE_OTHER): Payer: BC Managed Care – PPO

## 2019-06-22 DIAGNOSIS — J309 Allergic rhinitis, unspecified: Secondary | ICD-10-CM

## 2019-06-29 ENCOUNTER — Ambulatory Visit (INDEPENDENT_AMBULATORY_CARE_PROVIDER_SITE_OTHER): Payer: BC Managed Care – PPO | Admitting: *Deleted

## 2019-06-29 DIAGNOSIS — J309 Allergic rhinitis, unspecified: Secondary | ICD-10-CM | POA: Diagnosis not present

## 2019-07-06 ENCOUNTER — Ambulatory Visit (INDEPENDENT_AMBULATORY_CARE_PROVIDER_SITE_OTHER): Payer: BC Managed Care – PPO

## 2019-07-06 DIAGNOSIS — J309 Allergic rhinitis, unspecified: Secondary | ICD-10-CM

## 2019-08-03 ENCOUNTER — Ambulatory Visit (INDEPENDENT_AMBULATORY_CARE_PROVIDER_SITE_OTHER): Payer: BC Managed Care – PPO | Admitting: *Deleted

## 2019-08-03 DIAGNOSIS — J309 Allergic rhinitis, unspecified: Secondary | ICD-10-CM

## 2019-08-31 ENCOUNTER — Ambulatory Visit (INDEPENDENT_AMBULATORY_CARE_PROVIDER_SITE_OTHER): Payer: BC Managed Care – PPO

## 2019-08-31 DIAGNOSIS — J309 Allergic rhinitis, unspecified: Secondary | ICD-10-CM

## 2019-10-05 ENCOUNTER — Ambulatory Visit (INDEPENDENT_AMBULATORY_CARE_PROVIDER_SITE_OTHER): Payer: BC Managed Care – PPO

## 2019-10-05 DIAGNOSIS — J309 Allergic rhinitis, unspecified: Secondary | ICD-10-CM

## 2019-10-11 NOTE — Progress Notes (Signed)
Vial exp 10-10-20

## 2019-10-13 DIAGNOSIS — J3089 Other allergic rhinitis: Secondary | ICD-10-CM

## 2019-11-02 ENCOUNTER — Ambulatory Visit (INDEPENDENT_AMBULATORY_CARE_PROVIDER_SITE_OTHER): Payer: BC Managed Care – PPO

## 2019-11-02 DIAGNOSIS — J309 Allergic rhinitis, unspecified: Secondary | ICD-10-CM | POA: Diagnosis not present

## 2019-11-30 ENCOUNTER — Encounter: Payer: Self-pay | Admitting: Allergy and Immunology

## 2019-11-30 ENCOUNTER — Other Ambulatory Visit: Payer: Self-pay

## 2019-11-30 ENCOUNTER — Ambulatory Visit: Payer: BC Managed Care – PPO | Admitting: Allergy and Immunology

## 2019-11-30 VITALS — BP 120/80 | HR 61 | Temp 97.6°F | Resp 18

## 2019-11-30 DIAGNOSIS — J309 Allergic rhinitis, unspecified: Secondary | ICD-10-CM | POA: Diagnosis not present

## 2019-11-30 DIAGNOSIS — J4530 Mild persistent asthma, uncomplicated: Secondary | ICD-10-CM

## 2019-11-30 MED ORDER — EPINEPHRINE 0.3 MG/0.3ML IJ SOAJ: 0.3000 mg | Freq: Once | INTRAMUSCULAR | 1 refills | Status: DC | PRN

## 2019-11-30 MED ORDER — ALBUTEROL SULFATE HFA 108 (90 BASE) MCG/ACT IN AERS: 2.0000 | INHALATION_SPRAY | RESPIRATORY_TRACT | 1 refills | Status: DC | PRN

## 2019-11-30 NOTE — Patient Instructions (Signed)
°  1. Continue immunotherapy and Auvi-Q  2. Continue Flonase one spray each nostril 1 or 2 times per day depending on disease activity  3. Continue antihistamine if needed  4. Continue ProAir HFA if needed  5. Return to clinic in 1 year or earlier if problem  6.  Obtain flu vaccine (and COVID booster when available)

## 2019-11-30 NOTE — Progress Notes (Signed)
[place] [facility] [place] [facility] [place] - [place] David May   Follow-up Note  Referring Provider: Deatra James, MD Primary Provider: Deatra James, MD Date of Office Visit: 11/30/2019  Subjective:   David May (DOB: 10/30/1980) is a 39 y.o. male who [facility] on 11/30/2019 in re-evaluation of the following:  HPI: David May presents to this clinic in evaluation of asthma and allergic rhinoconjunctivitis.  His last visit to this clinic was 27 October 2018.  He has really done well with his airway and has not required a systemic steroid or antibiotic for any type of airway issue and rarely uses a short acting bronchodilator and can exercise without any problem.  He continues to utilize [employer] every day.  He continues on immunotherapy currently at every 4 weeks without any adverse effect.  This treatment has allowed him to go through each season of the year with no difficulty at all.  He has obtained 2 Pfizer Covid vaccinations.  Allergies as of 11/30/2019      Reactions   Codeine Itching   Hydrocodone-acetaminophen Rash      Medication List      acyclovir 200 MG capsule Commonly known as: ZOVIRAX Take 200 mg by mouth as needed.   cetirizine 10 MG tablet Commonly known as: ZYRTEC Take 10 mg by mouth as needed for allergies.   EPINEPHrine 0.3 mg/0.3 mL Soaj injection Commonly known as: EpiPen 2-Pak Inject 0.3 mLs (0.3 mg total) into the muscle once as needed for up to 1 dose for anaphylaxis.   fluticasone 50 MCG/ACT nasal spray Commonly known as: FLONASE SPRAY 2 SPRAYS INTO EACH NOSTRIL EVERY DAY   Fluticasone-Salmeterol 100-50 MCG/DOSE Aepb Commonly known as: Advair Diskus USE ONE INHALATION EVERY 12 HOURS TO PREVENT COUGH OR WHEEZE.   lansoprazole 30 MG capsule Commonly known as: PREVACID Take 30 mg by mouth daily.   ProAir HFA 108 (90 Base) MCG/ACT inhaler Generic drug: albuterol Inhale 2 puffs into the lungs every 4 (four) hours as  needed for wheezing or shortness of breath.   albuterol 108 (90 Base) MCG/ACT inhaler Commonly known as: VENTOLIN HFA Inhale 2 puffs into the lungs every 4 (four) hours as needed for wheezing or shortness of breath.       Past Medical History:  Diagnosis Date  . Asthma   . Pseudotumor cerebri     History reviewed. No pertinent surgical history.  Review of systems negative except as noted in HPI / PMHx or noted below:  Review of Systems  Constitutional: Negative.   HENT: Negative.   Eyes: Negative.   Respiratory: Negative.   Cardiovascular: Negative.   Gastrointestinal: Negative.   Genitourinary: Negative.   Musculoskeletal: Negative.   Skin: Negative.   Neurological: Negative.   Endo/Heme/Allergies: Negative.   Psychiatric/Behavioral: Negative.      Objective:   Vitals:   11/30/19 1535  BP: 120/80  Pulse: 61  Resp: 18  Temp: 97.6 F (36.4 C)  SpO2: 96%          Physical Exam Constitutional:      Appearance: He is not diaphoretic.  HENT:     Head: Normocephalic.     Right Ear: Tympanic membrane, ear canal and external ear normal.     Left Ear: Tympanic membrane, ear canal and external ear normal.     Nose: Nose normal. No mucosal edema or rhinorrhea.     Mouth/Throat:     Pharynx: Uvula midline. No oropharyngeal exudate.  Eyes:  Conjunctiva/sclera: Conjunctivae normal.  Neck:     Thyroid: No thyromegaly.     Trachea: Trachea normal. No tracheal tenderness or tracheal deviation.  Cardiovascular:     Rate and Rhythm: Normal rate and regular rhythm.     Heart sounds: Normal heart sounds, S1 normal and S2 normal. No murmur heard.   Pulmonary:     Effort: No respiratory distress.     Breath sounds: Normal breath sounds. No stridor. No wheezing or rales.  Lymphadenopathy:     Head:     Right side of head: No tonsillar adenopathy.     Left side of head: No tonsillar adenopathy.     Cervical: No cervical adenopathy.  Skin:    Findings: No  erythema or rash.     Nails: There is no clubbing.  Neurological:     Mental Status: He is alert.     Diagnostics:    Spirometry was performed and demonstrated an FEV1 of 3.51 at 99 % of predicted.  The patient had an Asthma Control Test with the following results: ACT Total Score: 24.    Assessment and Plan:   1. Asthma, well controlled, mild persistent   2. Allergic rhinitis, unspecified seasonality, unspecified trigger     1. Continue immunotherapy and Auvi-Q  2. Continue Flonase one spray each nostril 1 or 2 times per day depending on disease activity  3. Continue antihistamine if needed  4. Continue ProAir HFA if needed  5. Return to clinic in 1 year or earlier if problem  6.  Obtain flu vaccine (and COVID booster when available)  David May appears to be doing very well and he will continue on immunotherapy and Flonase at this point in time as he goes through this upcoming year.  He will contact me should he develop any significant problems during the interval.  Laurette Schimke, MD Allergy / Immunology [facility]

## 2019-12-01 ENCOUNTER — Encounter: Payer: Self-pay | Admitting: Allergy and Immunology

## 2019-12-28 ENCOUNTER — Ambulatory Visit (INDEPENDENT_AMBULATORY_CARE_PROVIDER_SITE_OTHER): Payer: BC Managed Care – PPO | Admitting: *Deleted

## 2019-12-28 DIAGNOSIS — J309 Allergic rhinitis, unspecified: Secondary | ICD-10-CM | POA: Diagnosis not present

## 2020-01-17 ENCOUNTER — Encounter: Payer: Self-pay | Admitting: Allergy and Immunology

## 2020-01-26 ENCOUNTER — Other Ambulatory Visit: Payer: Self-pay | Admitting: Neurosurgery

## 2020-01-26 ENCOUNTER — Other Ambulatory Visit: Payer: Self-pay | Admitting: Gastroenterology

## 2020-01-26 DIAGNOSIS — R1030 Lower abdominal pain, unspecified: Secondary | ICD-10-CM

## 2020-02-01 ENCOUNTER — Ambulatory Visit (INDEPENDENT_AMBULATORY_CARE_PROVIDER_SITE_OTHER): Payer: BC Managed Care – PPO

## 2020-02-01 DIAGNOSIS — J309 Allergic rhinitis, unspecified: Secondary | ICD-10-CM

## 2020-02-10 ENCOUNTER — Ambulatory Visit (INDEPENDENT_AMBULATORY_CARE_PROVIDER_SITE_OTHER): Payer: BC Managed Care – PPO

## 2020-02-10 ENCOUNTER — Ambulatory Visit
Admission: RE | Admit: 2020-02-10 | Discharge: 2020-02-10 | Disposition: A | Discharge status: Home / Self Care | Payer: BC Managed Care – PPO | Source: Ambulatory Visit | Attending: Gastroenterology | Admitting: Gastroenterology

## 2020-02-10 DIAGNOSIS — R1030 Lower abdominal pain, unspecified: Secondary | ICD-10-CM

## 2020-02-10 DIAGNOSIS — J309 Allergic rhinitis, unspecified: Secondary | ICD-10-CM | POA: Diagnosis not present

## 2020-02-10 MED ORDER — IOPAMIDOL (ISOVUE-300) INJECTION 61%
100.0000 mL | Freq: Once | INTRAVENOUS | Status: AC | PRN
  Administered 2020-02-10: 100 mL via INTRAVENOUS

## 2020-02-17 ENCOUNTER — Ambulatory Visit (INDEPENDENT_AMBULATORY_CARE_PROVIDER_SITE_OTHER): Payer: BC Managed Care – PPO

## 2020-02-17 DIAGNOSIS — J309 Allergic rhinitis, unspecified: Secondary | ICD-10-CM | POA: Diagnosis not present

## 2020-02-24 ENCOUNTER — Ambulatory Visit (INDEPENDENT_AMBULATORY_CARE_PROVIDER_SITE_OTHER): Payer: BC Managed Care – PPO

## 2020-02-24 DIAGNOSIS — J309 Allergic rhinitis, unspecified: Secondary | ICD-10-CM | POA: Diagnosis not present

## 2020-03-03 ENCOUNTER — Ambulatory Visit (INDEPENDENT_AMBULATORY_CARE_PROVIDER_SITE_OTHER): Payer: BC Managed Care – PPO

## 2020-03-03 DIAGNOSIS — J309 Allergic rhinitis, unspecified: Secondary | ICD-10-CM | POA: Diagnosis not present

## 2020-03-30 ENCOUNTER — Ambulatory Visit (INDEPENDENT_AMBULATORY_CARE_PROVIDER_SITE_OTHER): Payer: BC Managed Care – PPO

## 2020-03-30 DIAGNOSIS — J309 Allergic rhinitis, unspecified: Secondary | ICD-10-CM | POA: Diagnosis not present

## 2020-05-04 ENCOUNTER — Encounter: Payer: Self-pay | Admitting: Allergy and Immunology

## 2020-05-12 ENCOUNTER — Ambulatory Visit (INDEPENDENT_AMBULATORY_CARE_PROVIDER_SITE_OTHER): Payer: BC Managed Care – PPO

## 2020-05-12 DIAGNOSIS — J309 Allergic rhinitis, unspecified: Secondary | ICD-10-CM

## 2020-06-08 ENCOUNTER — Ambulatory Visit (INDEPENDENT_AMBULATORY_CARE_PROVIDER_SITE_OTHER): Payer: BC Managed Care – PPO

## 2020-06-08 DIAGNOSIS — J309 Allergic rhinitis, unspecified: Secondary | ICD-10-CM

## 2020-06-20 ENCOUNTER — Ambulatory Visit: Payer: Self-pay

## 2020-06-20 NOTE — Progress Notes (Signed)
Made in error.No shot given

## 2020-07-07 ENCOUNTER — Ambulatory Visit (INDEPENDENT_AMBULATORY_CARE_PROVIDER_SITE_OTHER): Payer: BC Managed Care – PPO | Admitting: *Deleted

## 2020-07-07 DIAGNOSIS — J309 Allergic rhinitis, unspecified: Secondary | ICD-10-CM

## 2020-07-12 ENCOUNTER — Ambulatory Visit (INDEPENDENT_AMBULATORY_CARE_PROVIDER_SITE_OTHER): Payer: BC Managed Care – PPO

## 2020-07-12 ENCOUNTER — Other Ambulatory Visit: Payer: Self-pay

## 2020-07-12 ENCOUNTER — Ambulatory Visit: Payer: BC Managed Care – PPO | Admitting: Podiatry

## 2020-07-12 ENCOUNTER — Encounter: Payer: Self-pay | Admitting: Podiatry

## 2020-07-12 DIAGNOSIS — M7742 Metatarsalgia, left foot: Secondary | ICD-10-CM

## 2020-07-12 DIAGNOSIS — M7741 Metatarsalgia, right foot: Secondary | ICD-10-CM | POA: Diagnosis not present

## 2020-07-12 DIAGNOSIS — Q666 Other congenital valgus deformities of feet: Secondary | ICD-10-CM

## 2020-07-12 NOTE — Progress Notes (Signed)
Subjective:  Patient ID: David May, male    DOB: 11-24-1980,  MRN: [id number]  Chief Complaint  Patient presents with  . Foot Pain    Pain under the toes after long distances pt stated that it feels like he is walking on bone when his foot hits the floor    40 y.o. male presents with the above complaint.  Patient presents with complaint of bilateral forefoot pain.  Patient states it happens after running for long distances.  He states it feels like his walking on bone.  He gets the achiness after he has been very active.  He also has a job that requires a lot of movement on his foot.  He feels like he is walking on his bone he denies any other acute complaints he has not seen anyone else prior to seeing me.  He would like to discuss treatment options for this.  His pain scale 7 out of 10 at its worst.   Review of Systems: Negative except as noted in the HPI. Denies N/V/F/Ch.  Past Medical History:  Diagnosis Date  . Asthma   . Pseudotumor cerebri     Current Outpatient Medications:  .  acyclovir (ZOVIRAX) 200 MG capsule, Take 200 mg by mouth as needed., Disp: , Rfl:  .  albuterol (PROAIR HFA) 108 (90 BASE) MCG/ACT inhaler, Inhale 2 puffs into the lungs every 4 (four) hours as needed for wheezing or shortness of breath., Disp: , Rfl:  .  albuterol (VENTOLIN HFA) 108 (90 Base) MCG/ACT inhaler, Inhale 2 puffs into the lungs every 4 (four) hours as needed for wheezing or shortness of breath., Disp: 18 g, Rfl: 1 .  cetirizine (ZYRTEC) 10 MG tablet, Take 10 mg by mouth as needed for allergies., Disp: , Rfl:  .  EPINEPHrine (EPIPEN 2-PAK) 0.3 mg/0.3 mL IJ SOAJ injection, Inject 0.3 mLs (0.3 mg total) into the muscle once as needed for up to 1 dose for anaphylaxis., Disp: 2 each, Rfl: 1 .  fluticasone (FLONASE) 50 MCG/ACT nasal spray, SPRAY 2 SPRAYS INTO EACH NOSTRIL EVERY DAY, Disp: 16 g, Rfl: 11 .  Fluticasone-Salmeterol (ADVAIR DISKUS) 100-50 MCG/DOSE AEPB, USE ONE INHALATION EVERY 12  HOURS TO PREVENT COUGH OR WHEEZE., Disp: 60 each, Rfl: 5 .  lansoprazole (PREVACID) 30 MG capsule, Take 30 mg by mouth daily., Disp: , Rfl:   Social History   Tobacco Use  Smoking Status Never Smoker  Smokeless Tobacco Never Used    Allergies  Allergen Reactions  . Codeine Itching  . Hydrocodone-Acetaminophen Rash   Objective:  There were no vitals filed for this visit. There is no height or weight on file to calculate BMI. Constitutional Well developed. Well nourished.  Vascular Dorsalis pedis pulses palpable bilaterally. Posterior tibial pulses palpable bilaterally. Capillary refill normal to all digits.  No cyanosis or clubbing noted. Pedal hair growth normal.  Neurologic Normal speech. Oriented to person, place, and time. Epicritic sensation to light touch grossly present bilaterally.  Dermatologic Nails well groomed and normal in appearance. No open wounds. No skin lesions.  Orthopedic:  Pain across the forefoot with pain along the heads of metatarsals 2345.  There is a likely chance of possible neuroma in the first interspace and third interspace.  No numbness or tingling noted upon lateral squeeze of the neuroma.  Negative Mulder's click.   Radiographs: 3 views of skeletally mature adult bilateral foot: No osseous abnormalities noted.  No Lendell Caprice sign noted on x-ray.  There is decreasing calcaneal inclination  angle increasing talar declination angle mild anterior break in the cyma line findings are consistent with pes planovalgus deformity semiflexible. Assessment:   1. Metatarsalgia of both feet   2. Pes planovalgus    Plan:  Patient was evaluated and treated and all questions answered.  Bilateral metatarsalgia with underlying pes planovalgus -I explained the patient the etiology of metatarsalgia and various treatment options were extensively discussed.  Given the amount of pain that is having especially after long distance walking or running I believe patient will  benefit from shoe gear modification and orthotics management.  I discussed with the patient about the benefit of orthotics especially during activity and his relationship with metatarsalgia.  Patient states understanding would like to proceed with obtaining orthotics. -He will be scheduled with the orthotics department to get casted for orthotics.  I will see him back in 6 weeks to evaluate and see if there is a benefit with it.  If there is no improvement we will need to likely begin treatment for neuroma/capsulitis.  For now I will hold off on steroid injection. -I will also incorporate metatarsal pads into the orthotics as well.  No follow-ups on file.

## 2020-07-24 ENCOUNTER — Other Ambulatory Visit: Payer: BC Managed Care – PPO

## 2020-08-08 ENCOUNTER — Other Ambulatory Visit: Payer: Self-pay | Admitting: Family Medicine

## 2020-08-08 DIAGNOSIS — M898X9 Other specified disorders of bone, unspecified site: Secondary | ICD-10-CM

## 2020-08-08 DIAGNOSIS — J3089 Other allergic rhinitis: Secondary | ICD-10-CM | POA: Diagnosis not present

## 2020-08-08 NOTE — Progress Notes (Signed)
VIAL EXP 08-08-21 

## 2020-08-10 ENCOUNTER — Ambulatory Visit (INDEPENDENT_AMBULATORY_CARE_PROVIDER_SITE_OTHER): Payer: BC Managed Care – PPO | Admitting: *Deleted

## 2020-08-10 DIAGNOSIS — J309 Allergic rhinitis, unspecified: Secondary | ICD-10-CM | POA: Diagnosis not present

## 2020-08-11 ENCOUNTER — Other Ambulatory Visit: Payer: BC Managed Care – PPO

## 2020-08-17 ENCOUNTER — Ambulatory Visit
Admission: RE | Admit: 2020-08-17 | Discharge: 2020-08-17 | Disposition: A | Discharge status: Home / Self Care | Payer: BC Managed Care – PPO | Source: Ambulatory Visit | Attending: Family Medicine | Admitting: Family Medicine

## 2020-08-17 ENCOUNTER — Other Ambulatory Visit: Payer: Self-pay | Admitting: Family Medicine

## 2020-08-17 DIAGNOSIS — R9389 Abnormal findings on diagnostic imaging of other specified body structures: Secondary | ICD-10-CM

## 2020-08-23 ENCOUNTER — Ambulatory Visit: Payer: BC Managed Care – PPO | Admitting: Podiatry

## 2020-08-23 ENCOUNTER — Other Ambulatory Visit: Payer: Self-pay

## 2020-08-23 DIAGNOSIS — M7741 Metatarsalgia, right foot: Secondary | ICD-10-CM

## 2020-08-23 DIAGNOSIS — M7742 Metatarsalgia, left foot: Secondary | ICD-10-CM | POA: Diagnosis not present

## 2020-08-23 DIAGNOSIS — Q666 Other congenital valgus deformities of feet: Secondary | ICD-10-CM | POA: Diagnosis not present

## 2020-08-24 ENCOUNTER — Encounter: Payer: Self-pay | Admitting: Podiatry

## 2020-08-24 NOTE — Progress Notes (Signed)
Subjective:  Patient ID: David May, male    DOB: May 11, 1980,  MRN: [id number]  Chief Complaint  Patient presents with  . Foot Pain    PT stated that his left foot is doing a little better but he is still having some pain in his right foot     40 y.o. male presents with the above complaint.  Patient presents with complaint of bilateral metatarsalgia.  He states that happens after running long distances.  He has not tried running recently.  He states the padding does help.  He is also here to get casted for orthotics as he was not able to do a previous visit.   Review of Systems: Negative except as noted in the HPI. Denies N/V/F/Ch.  Past Medical History:  Diagnosis Date  . Asthma   . Pseudotumor cerebri     Current Outpatient Medications:  .  acyclovir (ZOVIRAX) 200 MG capsule, Take 200 mg by mouth as needed., Disp: , Rfl:  .  albuterol (PROAIR HFA) 108 (90 BASE) MCG/ACT inhaler, Inhale 2 puffs into the lungs every 4 (four) hours as needed for wheezing or shortness of breath., Disp: , Rfl:  .  albuterol (VENTOLIN HFA) 108 (90 Base) MCG/ACT inhaler, Inhale 2 puffs into the lungs every 4 (four) hours as needed for wheezing or shortness of breath., Disp: 18 g, Rfl: 1 .  cetirizine (ZYRTEC) 10 MG tablet, Take 10 mg by mouth as needed for allergies., Disp: , Rfl:  .  EPINEPHrine (EPIPEN 2-PAK) 0.3 mg/0.3 mL IJ SOAJ injection, Inject 0.3 mLs (0.3 mg total) into the muscle once as needed for up to 1 dose for anaphylaxis., Disp: 2 each, Rfl: 1 .  fluticasone (FLONASE) 50 MCG/ACT nasal spray, SPRAY 2 SPRAYS INTO EACH NOSTRIL EVERY DAY, Disp: 16 g, Rfl: 11 .  Fluticasone-Salmeterol (ADVAIR DISKUS) 100-50 MCG/DOSE AEPB, USE ONE INHALATION EVERY 12 HOURS TO PREVENT COUGH OR WHEEZE., Disp: 60 each, Rfl: 5 .  lansoprazole (PREVACID) 30 MG capsule, Take 30 mg by mouth daily., Disp: , Rfl:   Social History   Tobacco Use  Smoking Status Never Smoker  Smokeless Tobacco Never Used    Allergies   Allergen Reactions  . Codeine Itching  . Hydrocodone-Acetaminophen Rash   Objective:  There were no vitals filed for this visit. There is no height or weight on file to calculate BMI. Constitutional Well developed. Well nourished.  Vascular Dorsalis pedis pulses palpable bilaterally. Posterior tibial pulses palpable bilaterally. Capillary refill normal to all digits.  No cyanosis or clubbing noted. Pedal hair growth normal.  Neurologic Normal speech. Oriented to person, place, and time. Epicritic sensation to light touch grossly present bilaterally.  Dermatologic Nails well groomed and normal in appearance. No open wounds. No skin lesions.  Orthopedic:  Mild pain across the forefoot with pain along the heads of metatarsals 2345.  There is a less likely chance of possible neuroma in the first interspace and third interspace.  No numbness or tingling noted upon lateral squeeze of the neuroma.  Negative Mulder's click.   Radiographs: 3 views of skeletally mature adult bilateral foot: No osseous abnormalities noted.  No Lendell Caprice sign noted on x-ray.  There is decreasing calcaneal inclination angle increasing talar declination angle mild anterior break in the cyma line findings are consistent with pes planovalgus deformity semiflexible. Assessment:   1. Metatarsalgia of both feet   2. Pes planovalgus    Plan:  Patient was evaluated and treated and all questions answered.  Bilateral metatarsalgia  with underlying pes planovalgus -I explained the patient the etiology of metatarsalgia and various treatment options were extensively discussed.  Given the amount of pain that is having especially after long distance walking or running I believe patient will benefit from shoe gear modification and orthotics management.  I discussed with the patient about the benefit of orthotics especially during activity and his relationship with metatarsalgia.  He benefited significantly from metatarsal pads  therefore he will benefit from orthotics. -He was casted for orthotics with incorporation of metatarsal pads. -I will see him back again in 3 months for adjustment/evaluation.  No follow-ups on file.

## 2020-09-07 ENCOUNTER — Ambulatory Visit (INDEPENDENT_AMBULATORY_CARE_PROVIDER_SITE_OTHER): Payer: BC Managed Care – PPO | Admitting: *Deleted

## 2020-09-07 DIAGNOSIS — J309 Allergic rhinitis, unspecified: Secondary | ICD-10-CM

## 2020-09-12 ENCOUNTER — Other Ambulatory Visit: Payer: Self-pay

## 2020-09-12 ENCOUNTER — Ambulatory Visit (INDEPENDENT_AMBULATORY_CARE_PROVIDER_SITE_OTHER): Payer: BC Managed Care – PPO | Admitting: Podiatry

## 2020-09-12 DIAGNOSIS — M7742 Metatarsalgia, left foot: Secondary | ICD-10-CM

## 2020-09-12 DIAGNOSIS — M7741 Metatarsalgia, right foot: Secondary | ICD-10-CM

## 2020-09-12 DIAGNOSIS — Q666 Other congenital valgus deformities of feet: Secondary | ICD-10-CM

## 2020-09-12 NOTE — Progress Notes (Signed)
Patient presents today for orthotic pick up. Patient voices no new complaints.  Orthotics were fitted to patient's feet. No discomfort and no rubbing. Patient satisfied with the orthotics.  Orthotics were dispensed to patient with instructions for break in wear and to call the office with any concerns or questions. 

## 2020-09-12 NOTE — Patient Instructions (Signed)

## 2020-10-06 ENCOUNTER — Ambulatory Visit (INDEPENDENT_AMBULATORY_CARE_PROVIDER_SITE_OTHER): Payer: BC Managed Care – PPO

## 2020-10-06 DIAGNOSIS — J309 Allergic rhinitis, unspecified: Secondary | ICD-10-CM | POA: Diagnosis not present

## 2020-10-12 ENCOUNTER — Ambulatory Visit (INDEPENDENT_AMBULATORY_CARE_PROVIDER_SITE_OTHER): Payer: BC Managed Care – PPO

## 2020-10-12 DIAGNOSIS — J309 Allergic rhinitis, unspecified: Secondary | ICD-10-CM | POA: Diagnosis not present

## 2020-10-20 ENCOUNTER — Ambulatory Visit (INDEPENDENT_AMBULATORY_CARE_PROVIDER_SITE_OTHER): Payer: BC Managed Care – PPO

## 2020-10-20 DIAGNOSIS — J309 Allergic rhinitis, unspecified: Secondary | ICD-10-CM | POA: Diagnosis not present

## 2020-10-26 ENCOUNTER — Ambulatory Visit (INDEPENDENT_AMBULATORY_CARE_PROVIDER_SITE_OTHER): Payer: BC Managed Care – PPO | Admitting: *Deleted

## 2020-10-26 DIAGNOSIS — J309 Allergic rhinitis, unspecified: Secondary | ICD-10-CM | POA: Diagnosis not present

## 2020-11-02 ENCOUNTER — Ambulatory Visit (INDEPENDENT_AMBULATORY_CARE_PROVIDER_SITE_OTHER): Payer: BC Managed Care – PPO | Admitting: *Deleted

## 2020-11-02 DIAGNOSIS — J309 Allergic rhinitis, unspecified: Secondary | ICD-10-CM | POA: Diagnosis not present

## 2020-11-28 ENCOUNTER — Other Ambulatory Visit: Payer: Self-pay

## 2020-11-28 ENCOUNTER — Encounter: Payer: Self-pay | Admitting: Allergy and Immunology

## 2020-11-28 ENCOUNTER — Ambulatory Visit: Payer: BC Managed Care – PPO | Admitting: Allergy and Immunology

## 2020-11-28 VITALS — BP 122/72 | HR 57 | Temp 98.4°F | Resp 16 | Ht 64.0 in | Wt 177.8 lb

## 2020-11-28 DIAGNOSIS — J4530 Mild persistent asthma, uncomplicated: Secondary | ICD-10-CM | POA: Diagnosis not present

## 2020-11-28 DIAGNOSIS — J309 Allergic rhinitis, unspecified: Secondary | ICD-10-CM

## 2020-11-28 DIAGNOSIS — K219 Gastro-esophageal reflux disease without esophagitis: Secondary | ICD-10-CM

## 2020-11-28 MED ORDER — ALBUTEROL SULFATE HFA 108 (90 BASE) MCG/ACT IN AERS
2.0000 | INHALATION_SPRAY | RESPIRATORY_TRACT | 1 refills | Status: DC | PRN
Start: 2020-11-28 — End: 2021-11-06

## 2020-11-28 MED ORDER — EPINEPHRINE 0.3 MG/0.3ML IJ SOAJ: 0.3000 mg | Freq: Once | INTRAMUSCULAR | 1 refills | Status: DC | PRN

## 2020-11-28 MED ORDER — FLUTICASONE PROPIONATE 50 MCG/ACT NA SUSP: NASAL | 11 refills | Status: DC

## 2020-11-28 NOTE — Patient Instructions (Signed)
  1. Continue immunotherapy and Auvi-Q  2. Continue Flonase 1-2 sprays each nostril 1 or 2 times per day depending on disease activity  3. Continue antihistamine if needed  4. Continue ProAir HFA if needed  5. Treat reflux / LPR:   A.  Lansoprazole 30 mg -1 tablet in AM  B.  Famotidine 40 mg -1 tablet in p.m.  C.  Minimize all caffeine consumption  6. Return to clinic in 1 year or earlier if problem  7. Obtain flu vaccine

## 2020-11-28 NOTE — Progress Notes (Signed)
- [place] [facility] [place] - [place] David May   Follow-up Note  Referring Provider: Deatra James, MD Primary Provider: Deatra James, MD Date of Office Visit: 11/28/2020  Subjective:   David May (DOB: 1980/11/22) is a 40 y.o. male who [facility] in re-evaluation of the following:  HPI: Mildred returns to this clinic in reevaluation of asthma and allergic rhinoconjunctivitis and LPR.  His last visit to this clinic was 30 November 2019.  He is undergoing a course of immunotherapy currently at every 4 weeks without any adverse effect.  With a combination of immunotherapy and Flonase on most days he has really done well regarding his airway issue.  Rarely does he use a short acting bronchodilator and he can exercise without difficulty and is now undergoing some degree of weight training.  He has not required a systemic steroid or an antibiotic for any type of airway issue.  He does have a lot of throat clearing.  He has a history of LPR.  He drinks tea at lunchtime.  He uses Prevacid 1 time per day.  He has received 3 Pfizer COVID vaccines.  Allergies as of 11/28/2020       Reactions   Codeine Itching   Hydrocodone-acetaminophen Rash        Medication List    acyclovir 200 MG capsule Commonly known as: ZOVIRAX Take 200 mg by mouth as needed.   albuterol 108 (90 Base) MCG/ACT inhaler Commonly known as: VENTOLIN HFA Inhale 2 puffs into the lungs every 4 (four) hours as needed for wheezing or shortness of breath.   cetirizine 10 MG tablet Commonly known as: ZYRTEC Take 10 mg by mouth as needed for allergies.   EPINEPHrine 0.3 mg/0.3 mL Soaj injection Commonly known as: EpiPen 2-Pak Inject 0.3 mLs (0.3 mg total) into the muscle once as needed for up to 1 dose for anaphylaxis.   fluticasone 50 MCG/ACT nasal spray Commonly known as: FLONASE SPRAY 2 SPRAYS INTO EACH NOSTRIL EVERY DAY   Fluticasone-Salmeterol 100-50  MCG/DOSE Aepb Commonly known as: Advair Diskus USE ONE INHALATION EVERY 12 HOURS TO PREVENT COUGH OR WHEEZE.   lansoprazole 30 MG capsule Commonly known as: PREVACID Take 30 mg by mouth daily.     Past Medical History:  Diagnosis Date   Asthma    Pseudotumor cerebri     History reviewed. No pertinent surgical history.  Review of systems negative except as noted in HPI / PMHx or noted below:  Review of Systems  Constitutional: Negative.   HENT: Negative.    Eyes: Negative.   Respiratory: Negative.    Cardiovascular: Negative.   Gastrointestinal: Negative.   Genitourinary: Negative.   Musculoskeletal: Negative.   Skin: Negative.   Neurological: Negative.   Endo/Heme/Allergies: Negative.   Psychiatric/Behavioral: Negative.      Objective:   Vitals:   11/28/20 1537  BP: 122/72  Pulse: (!) 57  Resp: 16  Temp: 98.4 F (36.9 C)  SpO2: 96%   Height: 5\' 4"  (162.6 cm)  Weight: 177 lb 12.8 oz (80.6 kg)   Physical Exam Constitutional:      Appearance: He is not diaphoretic.  HENT:     Head: Normocephalic.     Right Ear: Tympanic membrane, ear canal and external ear normal.     Left Ear: Tympanic membrane, ear canal and external ear normal.     Nose: Nose normal. No mucosal edema or rhinorrhea.     Mouth/Throat:  Pharynx: Uvula midline. No oropharyngeal exudate.  Eyes:     Conjunctiva/sclera: Conjunctivae normal.  Neck:     Thyroid: No thyromegaly.     Trachea: Trachea normal. No tracheal tenderness or tracheal deviation.  Cardiovascular:     Rate and Rhythm: Normal rate and regular rhythm.     Heart sounds: Normal heart sounds, S1 normal and S2 normal. No murmur heard. Pulmonary:     Effort: No respiratory distress.     Breath sounds: Normal breath sounds. No stridor. No wheezing or rales.  Lymphadenopathy:     Head:     Right side of head: No tonsillar adenopathy.     Left side of head: No tonsillar adenopathy.     Cervical: No cervical adenopathy.   Skin:    Findings: No erythema or rash.     Nails: There is no clubbing.  Neurological:     Mental Status: He is alert.    Diagnostics:    Spirometry was performed and demonstrated an FEV1 of 3.40 at 97 % of predicted.  Assessment and Plan:   1. Allergic rhinitis, unspecified seasonality, unspecified trigger   2. Asthma, well controlled, mild persistent   3. LPRD (laryngopharyngeal reflux disease)     1. Continue immunotherapy and Auvi-Q  2. Continue Flonase 1-2 sprays each nostril 1 or 2 times per day depending on disease activity  3. Continue antihistamine if needed  4. Continue ProAir HFA if needed  5. Treat reflux / LPR:   A.  Lansoprazole 30 mg -1 tablet in AM  B.  Famotidine 40 mg -1 tablet in p.m.  C.  Minimize all caffeine consumption  6. Return to clinic in 1 year or earlier if problem  7. Obtain flu vaccine   David May appears to be doing relatively well regarding his atopic respiratory disease on his current plan and he will continue on immunotherapy and Flonase and various medications should they be required.  His reflux and LPR is a little bit active.  He can use a combination of a proton pump inhibitor and H2 receptor blocker and I have asked him to minimize all caffeine consumption.  Assuming he does well with this plan I will see him back in this clinic in 1 year or earlier if there is a problem.  Laurette Schimke, MD Allergy / Immunology [facility]

## 2020-11-29 ENCOUNTER — Encounter: Payer: Self-pay | Admitting: Allergy and Immunology

## 2020-12-28 ENCOUNTER — Ambulatory Visit (INDEPENDENT_AMBULATORY_CARE_PROVIDER_SITE_OTHER): Payer: BC Managed Care – PPO | Admitting: *Deleted

## 2020-12-28 DIAGNOSIS — J309 Allergic rhinitis, unspecified: Secondary | ICD-10-CM

## 2021-01-25 ENCOUNTER — Ambulatory Visit (INDEPENDENT_AMBULATORY_CARE_PROVIDER_SITE_OTHER): Payer: BC Managed Care – PPO | Admitting: *Deleted

## 2021-01-25 DIAGNOSIS — J309 Allergic rhinitis, unspecified: Secondary | ICD-10-CM | POA: Diagnosis not present

## 2021-02-22 ENCOUNTER — Ambulatory Visit (INDEPENDENT_AMBULATORY_CARE_PROVIDER_SITE_OTHER): Payer: BC Managed Care – PPO

## 2021-02-22 DIAGNOSIS — J309 Allergic rhinitis, unspecified: Secondary | ICD-10-CM

## 2021-03-22 DIAGNOSIS — J3089 Other allergic rhinitis: Secondary | ICD-10-CM

## 2021-03-26 NOTE — Progress Notes (Signed)
VIAL MADE. EXP 03-26-22 

## 2021-04-02 ENCOUNTER — Ambulatory Visit (INDEPENDENT_AMBULATORY_CARE_PROVIDER_SITE_OTHER): Payer: BC Managed Care – PPO

## 2021-04-02 DIAGNOSIS — J309 Allergic rhinitis, unspecified: Secondary | ICD-10-CM

## 2021-04-28 IMAGING — CT CT ABD-PELV W/ CM
2 of 4 series · 16 of 46 positions shown, 18 images · IV contrast (iopamidol)
Comparison: None.

CLINICAL DATA: 39-year-old male with lower abdominal pain.

EXAM:
CT ABDOMEN AND PELVIS WITH CONTRAST
TECHNIQUE: Multidetector CT imaging of the abdomen and pelvis was performed
using the standard protocol following bolus administration of
intravenous contrast.
CONTRAST:  100mL PUAI2P-E55 IOPAMIDOL ([id number]) INJECTION 61%

[Series 2: abd pelvis 5.00 br40 s3 axial · axial · 0.73mm/px · z∈[+1046,+1421]mm · 13 of 83 slices shown, 15 images]
[im 4/83  soft-tissue]
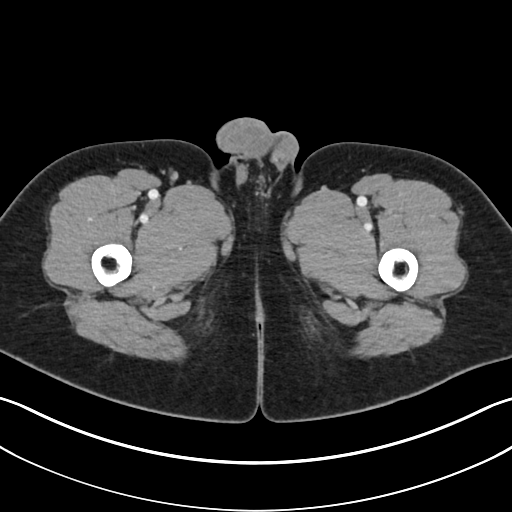
[im 4/83  bone]
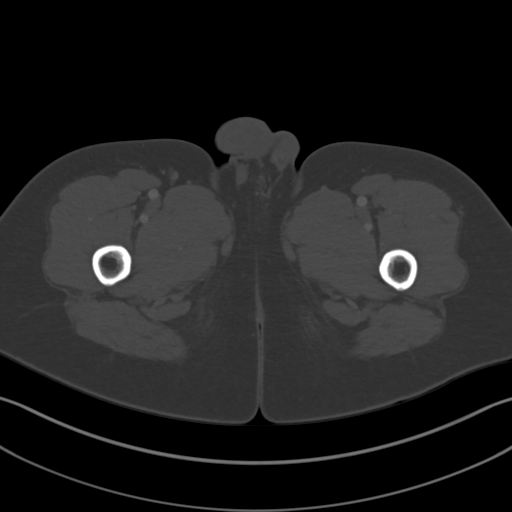
[im 12/83  soft-tissue]
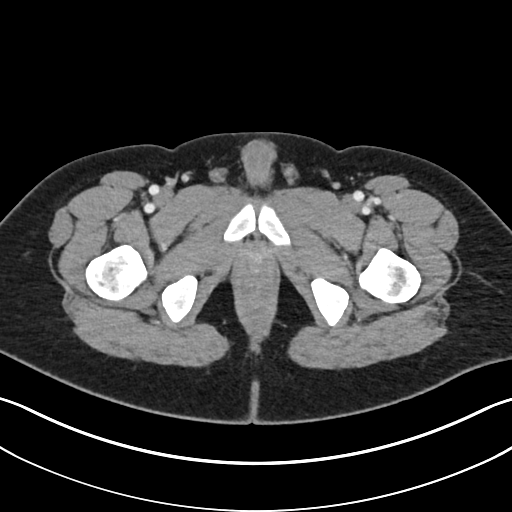
[im 19/83  soft-tissue]
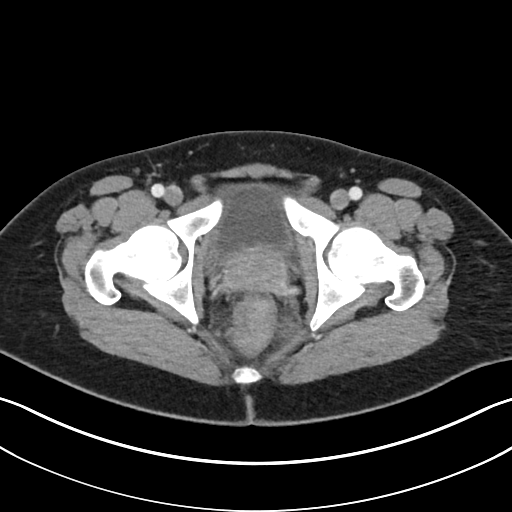
[im 23/83  soft-tissue]
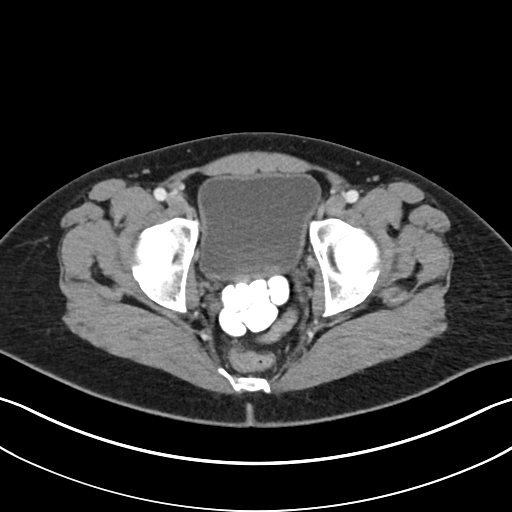
[im 30/83  soft-tissue]
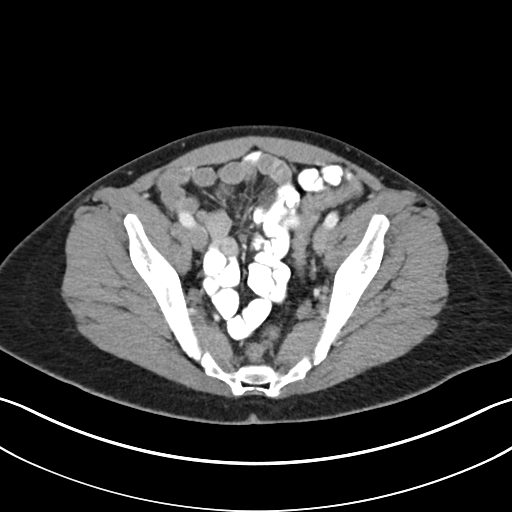
[im 34/83  soft-tissue]
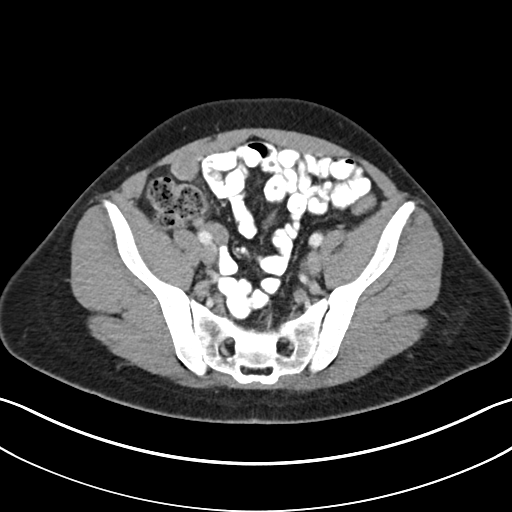
[im 42/83  soft-tissue]
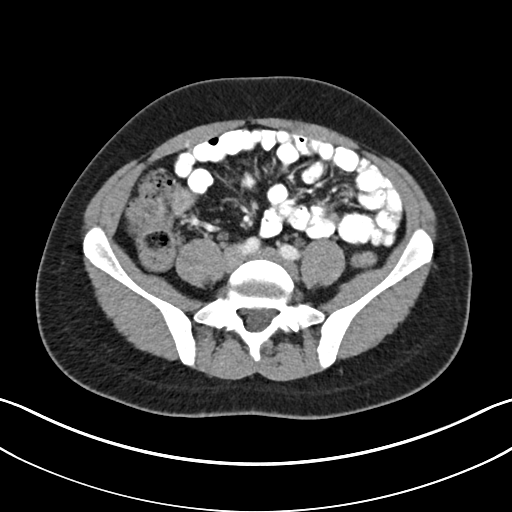
[im 49/83  soft-tissue]
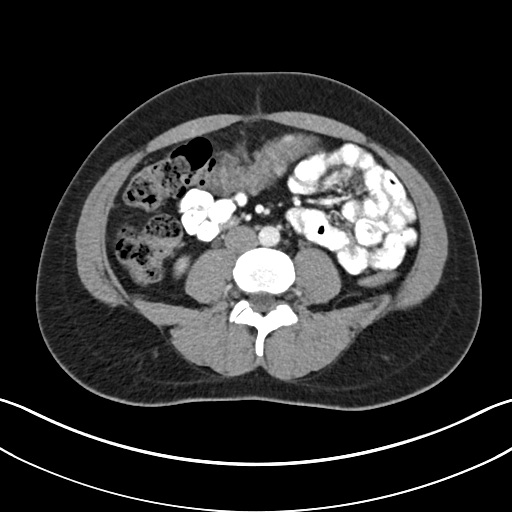
[im 53/83  soft-tissue]
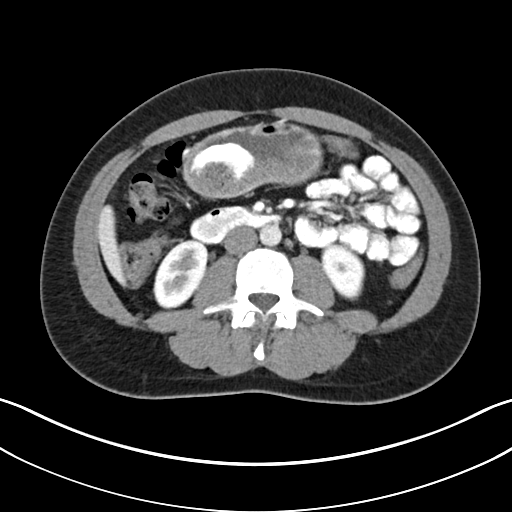
[im 53/83  bone]
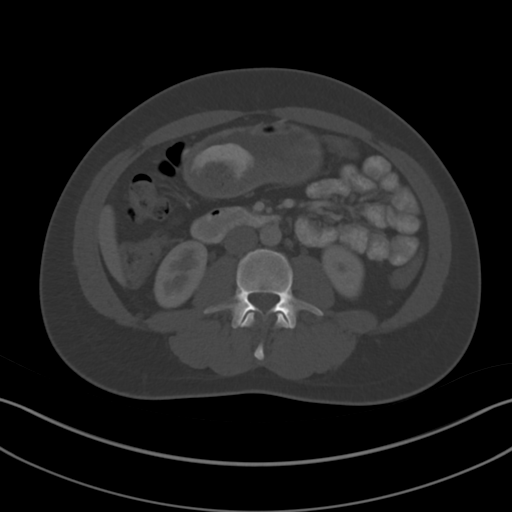
[im 60/83  soft-tissue]
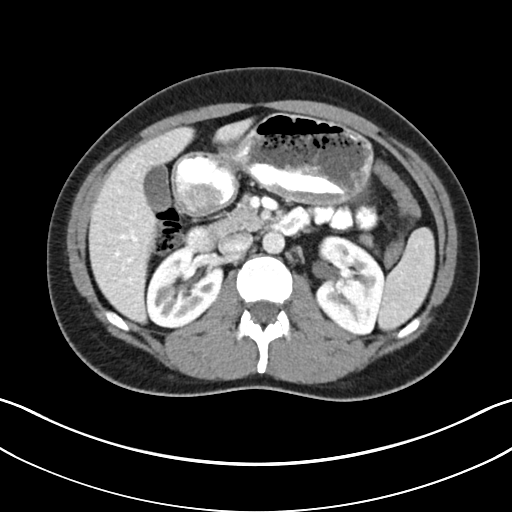
[im 64/83  soft-tissue]
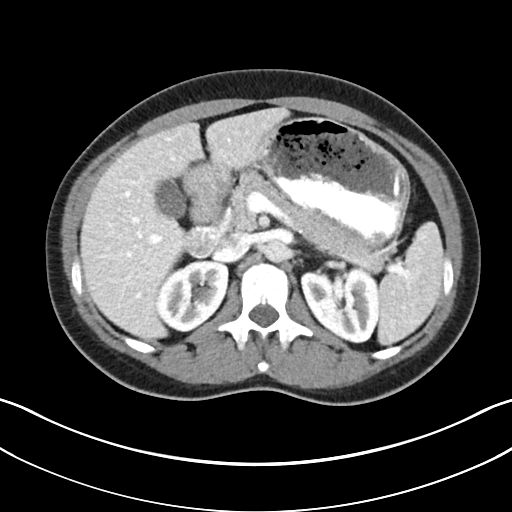
[im 71/83  soft-tissue]
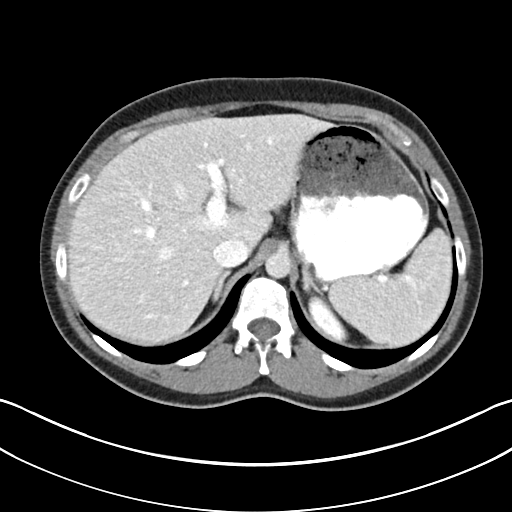
[im 79/83  soft-tissue]
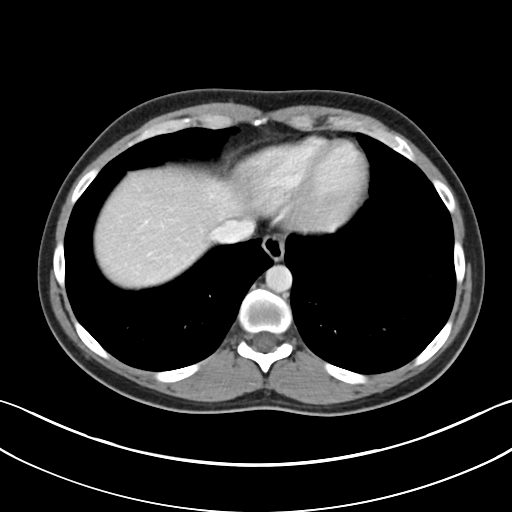

[Series 6: abd pelvis 2.00 br40 s3 cor · coronal · 0.73mm/px · 3 of 148 slices shown]
[im 50/148  soft-tissue]
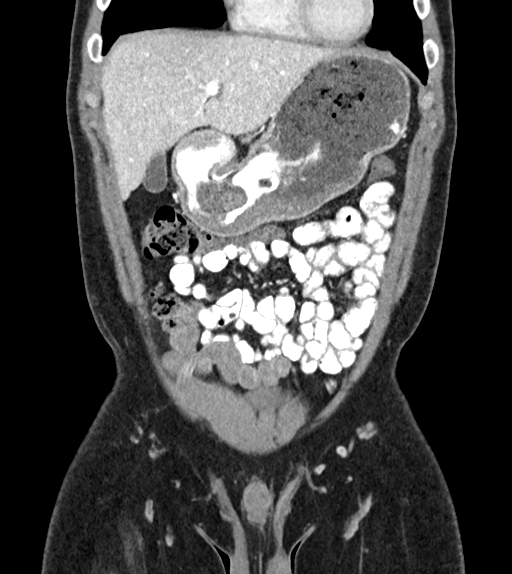
[im 66/148  soft-tissue]
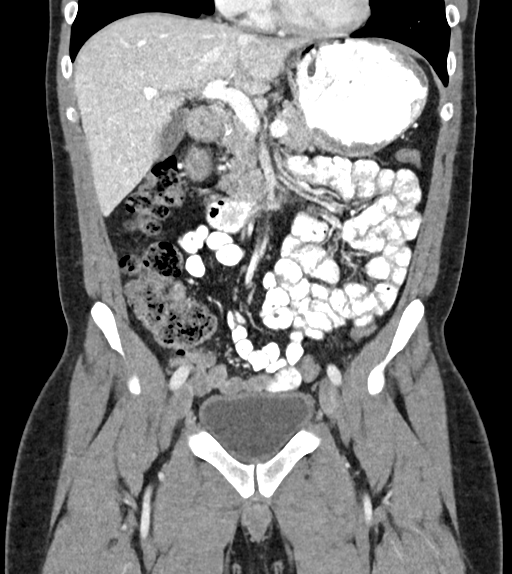
[im 82/148  soft-tissue]
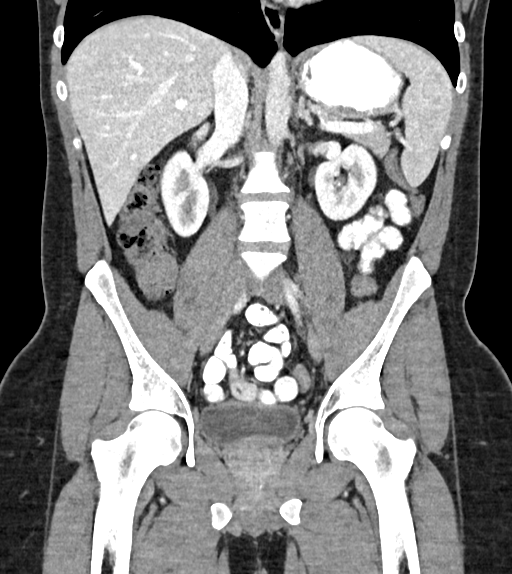

[16 of 46 positions shown; findings below may reference images not displayed]

FINDINGS: Lower chest: The visualized lung bases are clear.

No intra-abdominal free air or free fluid.

Hepatobiliary: No focal liver abnormality is seen. No gallstones,
gallbladder wall thickening, or biliary dilatation.

Pancreas: Unremarkable. No pancreatic ductal dilatation or
surrounding inflammatory changes.

Spleen: Normal in size without focal abnormality.

Adrenals/Urinary Tract: Adrenal glands are unremarkable. Kidneys are
normal, without renal calculi, focal lesion, or hydronephrosis.
Bladder is unremarkable.

Stomach/Bowel: Diffuse thickened appearance of the distal colon
likely related to underdistention. Mild colitis is less likely.
Clinical correlation is recommended. No pericolonic inflammatory
changes. The stomach is distended with ingested content. No evidence
of gastric outlet obstruction. There is no bowel obstruction or
active inflammation. The appendix is normal.

Vascular/Lymphatic: The abdominal aorta and IVC are unremarkable. No
portal venous gas. There is no adenopathy.

Reproductive: The prostate and seminal vesicles are grossly
unremarkable. No pelvic mass.

Other: None

Musculoskeletal: No acute osseous pathology. Indeterminate area of
sclerosis along the anterior aspect of the L1 vertebra, possibly a
bone island. Attention on follow-up imaging recommended.
IMPRESSION: No acute intra-abdominal or pelvic pathology.

## 2021-05-03 ENCOUNTER — Ambulatory Visit (INDEPENDENT_AMBULATORY_CARE_PROVIDER_SITE_OTHER): Payer: BC Managed Care – PPO

## 2021-05-03 DIAGNOSIS — J309 Allergic rhinitis, unspecified: Secondary | ICD-10-CM

## 2021-05-25 ENCOUNTER — Encounter: Payer: Self-pay | Admitting: Allergy and Immunology

## 2021-05-25 MED ORDER — FLUTICASONE-SALMETEROL 100-50 MCG/ACT IN AEPB: 1.0000 | INHALATION_SPRAY | Freq: Two times a day (BID) | RESPIRATORY_TRACT | 5 refills | Status: DC

## 2021-05-25 MED ORDER — PREDNISONE 10 MG PO TABS: ORAL_TABLET | ORAL | 0 refills | Status: DC

## 2021-05-31 ENCOUNTER — Ambulatory Visit (INDEPENDENT_AMBULATORY_CARE_PROVIDER_SITE_OTHER): Payer: BC Managed Care – PPO

## 2021-05-31 DIAGNOSIS — J309 Allergic rhinitis, unspecified: Secondary | ICD-10-CM

## 2021-06-06 ENCOUNTER — Ambulatory Visit (INDEPENDENT_AMBULATORY_CARE_PROVIDER_SITE_OTHER): Payer: BC Managed Care – PPO

## 2021-06-06 DIAGNOSIS — J309 Allergic rhinitis, unspecified: Secondary | ICD-10-CM

## 2021-06-14 ENCOUNTER — Ambulatory Visit (INDEPENDENT_AMBULATORY_CARE_PROVIDER_SITE_OTHER): Payer: BC Managed Care – PPO | Admitting: *Deleted

## 2021-06-14 DIAGNOSIS — J309 Allergic rhinitis, unspecified: Secondary | ICD-10-CM

## 2021-06-22 ENCOUNTER — Ambulatory Visit (INDEPENDENT_AMBULATORY_CARE_PROVIDER_SITE_OTHER): Payer: BC Managed Care – PPO

## 2021-06-22 DIAGNOSIS — J309 Allergic rhinitis, unspecified: Secondary | ICD-10-CM

## 2021-06-28 ENCOUNTER — Ambulatory Visit (INDEPENDENT_AMBULATORY_CARE_PROVIDER_SITE_OTHER): Payer: BC Managed Care – PPO

## 2021-06-28 DIAGNOSIS — J309 Allergic rhinitis, unspecified: Secondary | ICD-10-CM

## 2021-07-26 ENCOUNTER — Ambulatory Visit (INDEPENDENT_AMBULATORY_CARE_PROVIDER_SITE_OTHER): Payer: BC Managed Care – PPO

## 2021-07-26 DIAGNOSIS — J309 Allergic rhinitis, unspecified: Secondary | ICD-10-CM | POA: Diagnosis not present

## 2021-08-24 ENCOUNTER — Ambulatory Visit (INDEPENDENT_AMBULATORY_CARE_PROVIDER_SITE_OTHER): Payer: BC Managed Care – PPO

## 2021-08-24 DIAGNOSIS — J309 Allergic rhinitis, unspecified: Secondary | ICD-10-CM

## 2021-09-21 ENCOUNTER — Ambulatory Visit (INDEPENDENT_AMBULATORY_CARE_PROVIDER_SITE_OTHER): Payer: BC Managed Care – PPO

## 2021-09-21 DIAGNOSIS — J309 Allergic rhinitis, unspecified: Secondary | ICD-10-CM | POA: Diagnosis not present

## 2021-09-28 ENCOUNTER — Encounter: Payer: Self-pay | Admitting: Allergy and Immunology

## 2021-10-01 MED ORDER — PREDNISONE 10 MG PO TABS: ORAL_TABLET | ORAL | 0 refills | Status: DC

## 2021-10-01 NOTE — Telephone Encounter (Signed)
Please inform David May that I read his message today and I was not in the clinic on Friday to address this issue.  We can give him prednisone 10 mg a day for 10 days to help with the inflammation from this process.  Please keep [place] informed about response.

## 2021-10-25 ENCOUNTER — Ambulatory Visit (INDEPENDENT_AMBULATORY_CARE_PROVIDER_SITE_OTHER): Payer: BC Managed Care – PPO

## 2021-10-25 DIAGNOSIS — J309 Allergic rhinitis, unspecified: Secondary | ICD-10-CM

## 2021-11-03 IMAGING — DX DG LUMBAR SPINE 2-3V
3 series · 3 of 3 positions shown · non-contrast
Comparison: 02/10/2020

CLINICAL DATA: Follow-up sclerosis in the L1 vertebral body

EXAM:
LUMBAR SPINE - 2-3 VIEW

[dg lumbar spine 2-3 views (1 of 3)]
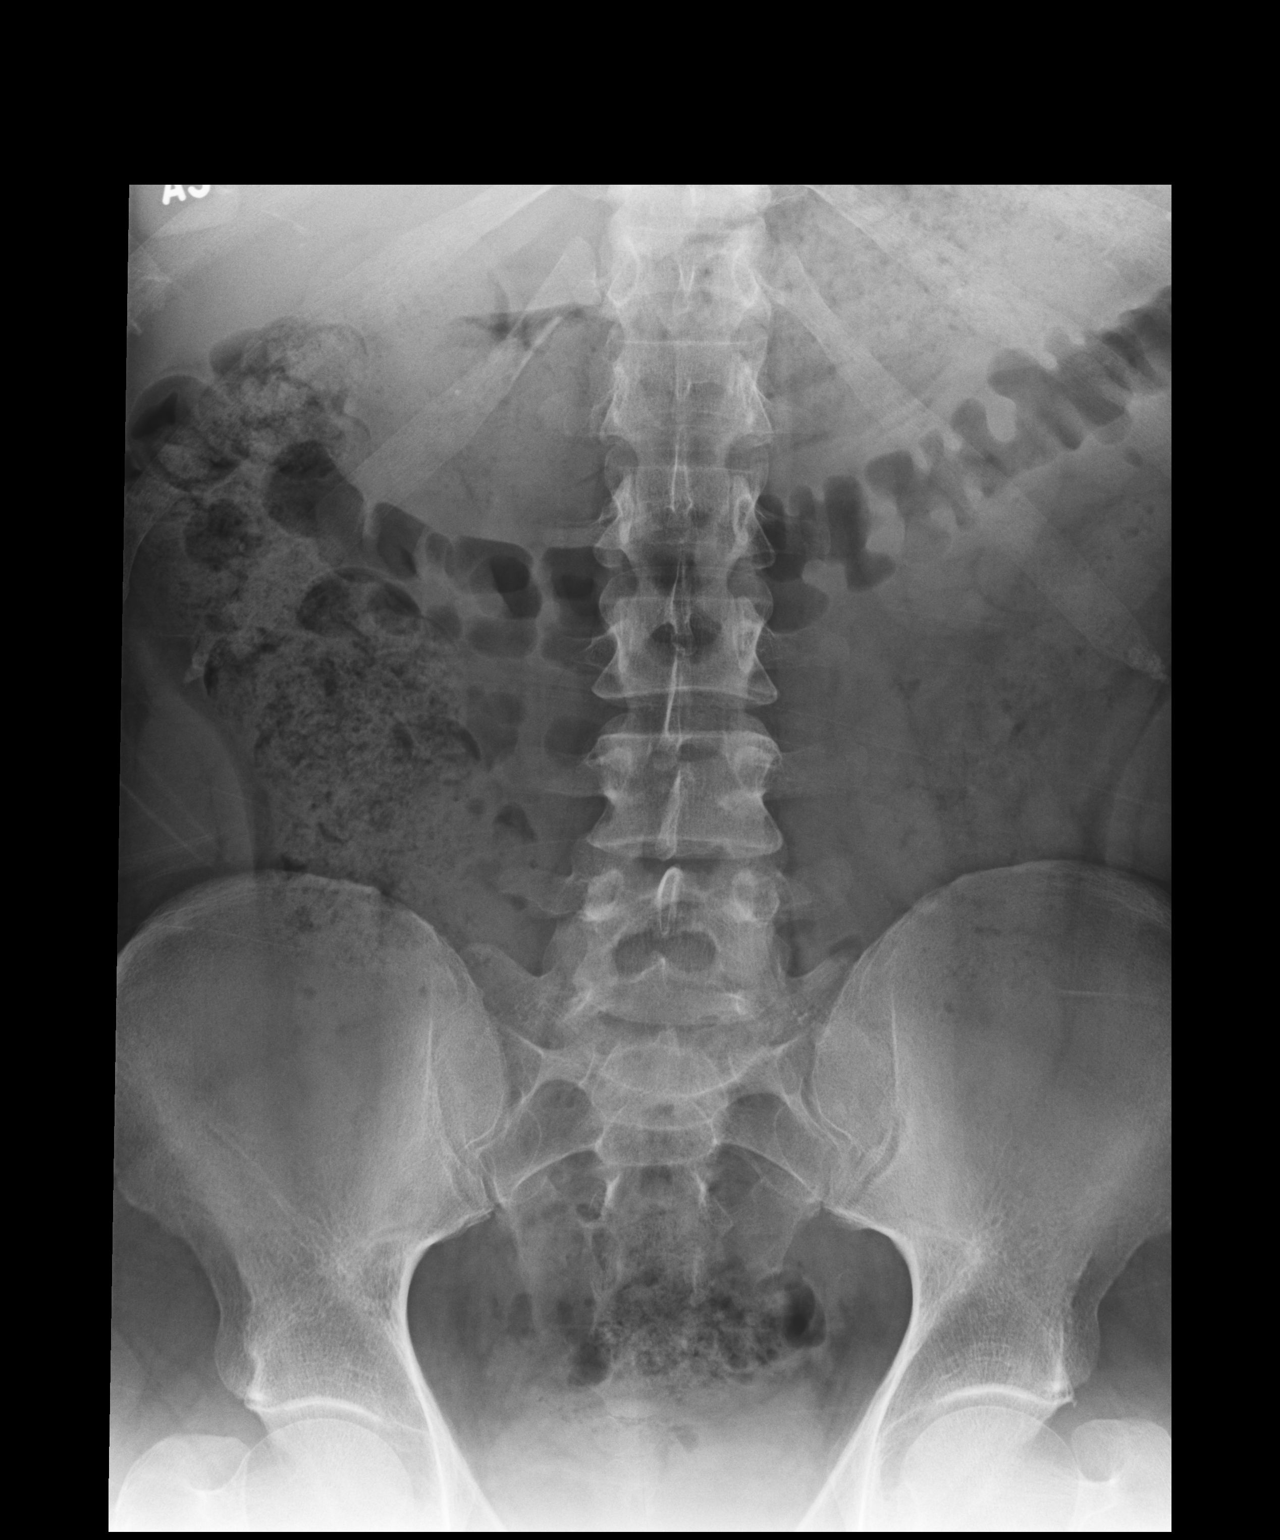

[dg lumbar spine 2-3 views (2 of 3)]
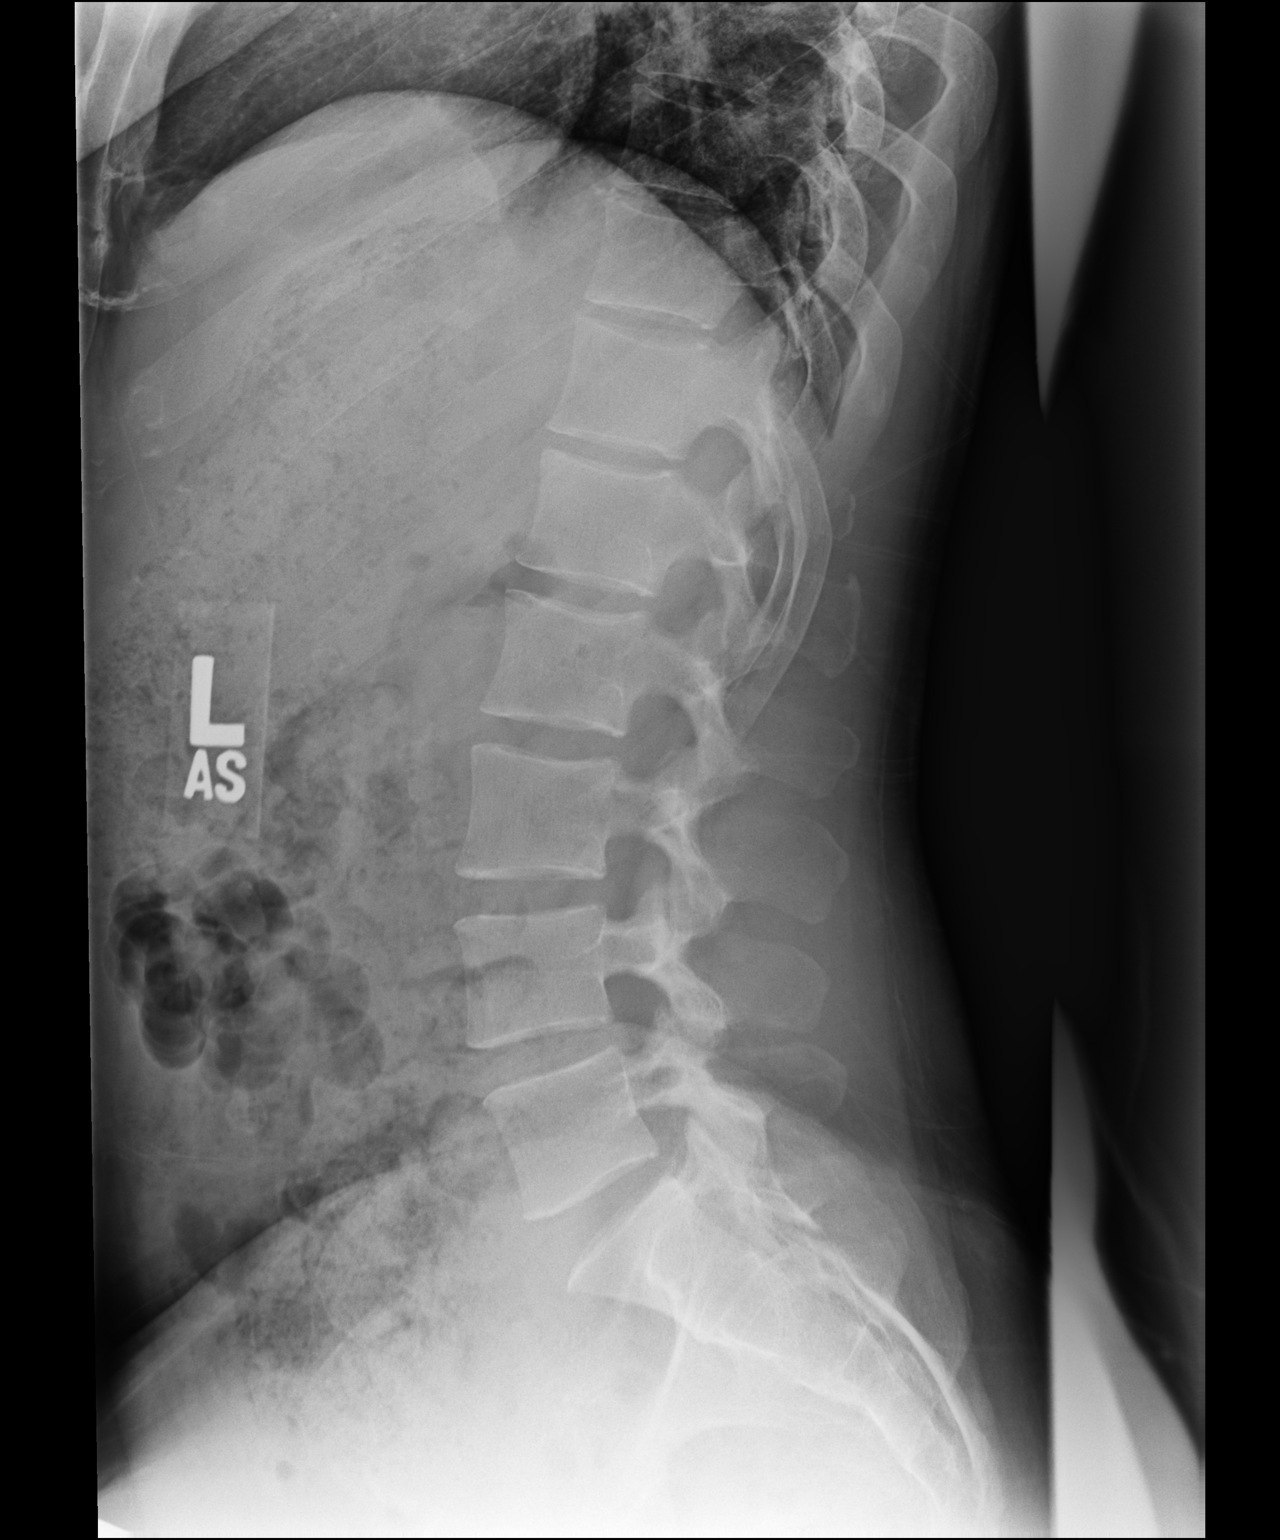

[dg lumbar spine 2-3 views (3 of 3)]
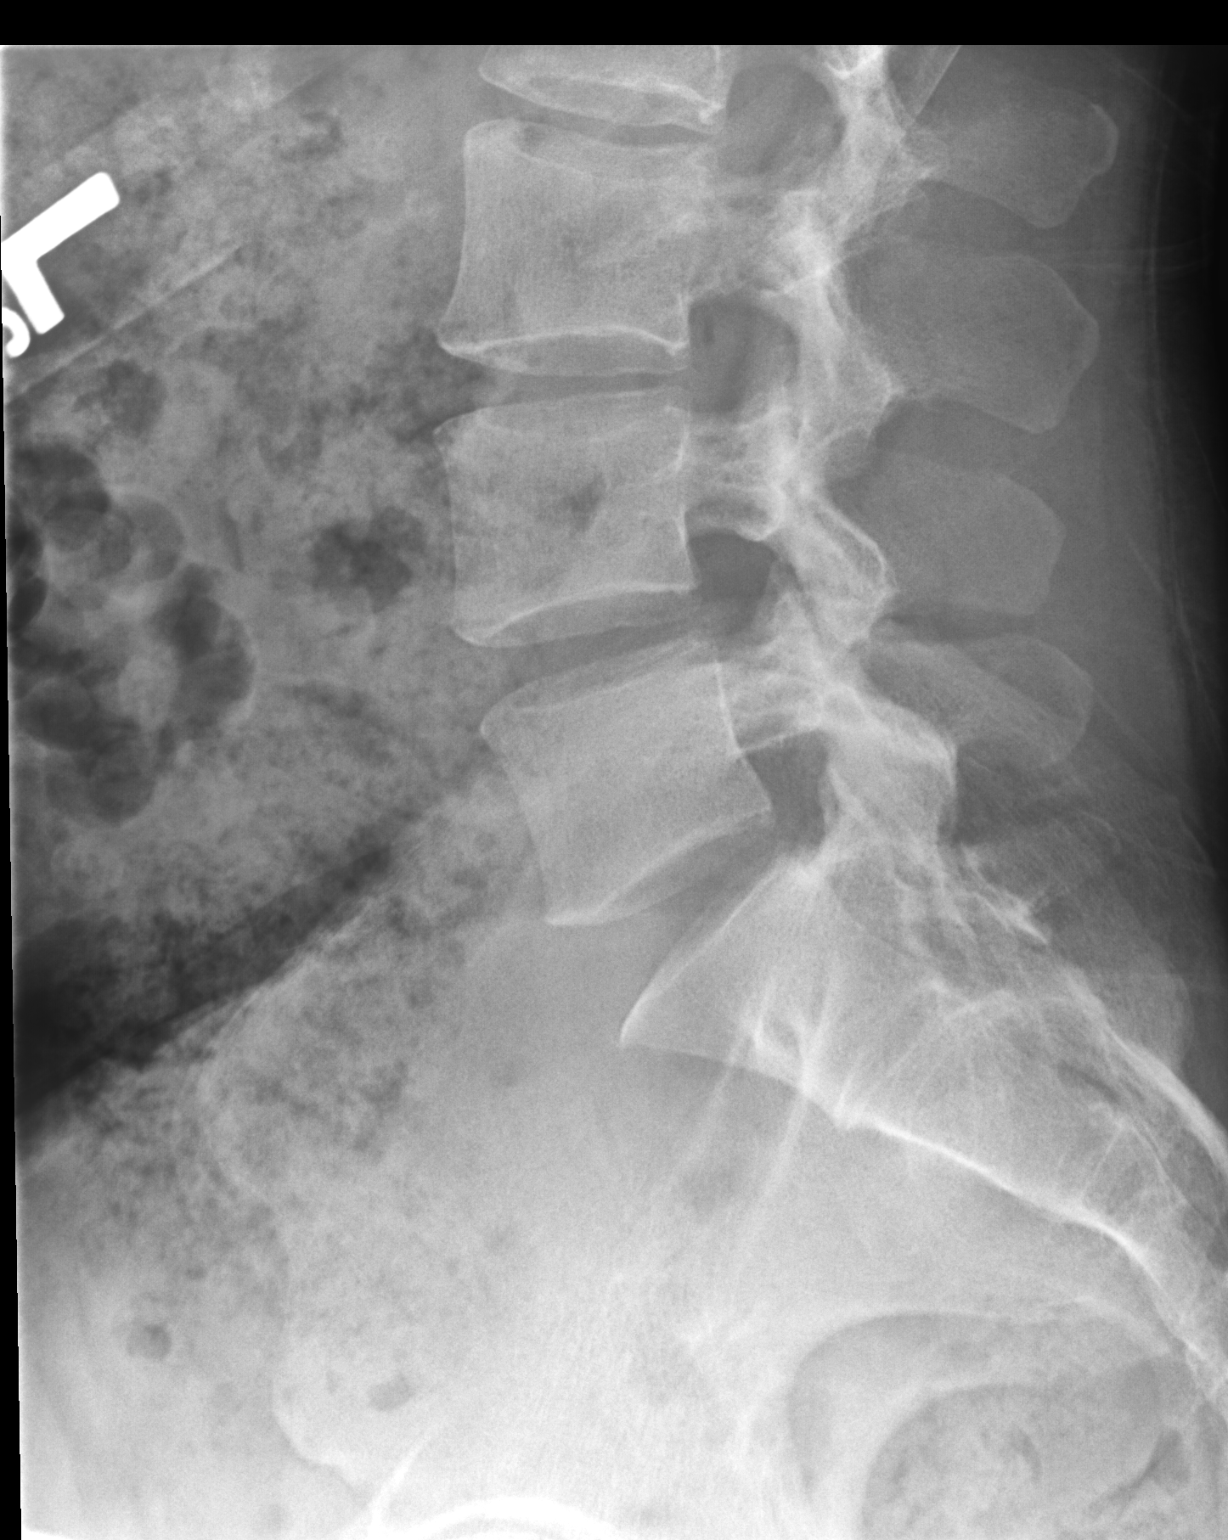

[3 of 3 positions shown; findings below may reference images not displayed]

FINDINGS: Five lumbar type vertebral bodies are well visualized. Vertebral
body height is well maintained. No anterolisthesis is noted.
Previously seen density in the anterior aspect of the L1 vertebral
body is not well appreciated on this exam. No other focal
abnormality is seen.
IMPRESSION: No acute abnormality noted. Previously seen sclerotic areas not well
appreciated.

## 2021-11-06 ENCOUNTER — Ambulatory Visit: Payer: BC Managed Care – PPO | Admitting: Allergy and Immunology

## 2021-11-06 ENCOUNTER — Encounter: Payer: Self-pay | Admitting: Allergy and Immunology

## 2021-11-06 VITALS — BP 98/62 | HR 62 | Temp 98.1°F | Resp 16 | Ht 64.0 in | Wt 181.8 lb

## 2021-11-06 DIAGNOSIS — J0140 Acute pansinusitis, unspecified: Secondary | ICD-10-CM | POA: Diagnosis not present

## 2021-11-06 DIAGNOSIS — J4540 Moderate persistent asthma, uncomplicated: Secondary | ICD-10-CM

## 2021-11-06 DIAGNOSIS — K219 Gastro-esophageal reflux disease without esophagitis: Secondary | ICD-10-CM | POA: Insufficient documentation

## 2021-11-06 DIAGNOSIS — F40243 Fear of flying: Secondary | ICD-10-CM | POA: Insufficient documentation

## 2021-11-06 DIAGNOSIS — J3089 Other allergic rhinitis: Secondary | ICD-10-CM

## 2021-11-06 DIAGNOSIS — H6983 Other specified disorders of Eustachian tube, bilateral: Secondary | ICD-10-CM

## 2021-11-06 DIAGNOSIS — B009 Herpesviral infection, unspecified: Secondary | ICD-10-CM | POA: Insufficient documentation

## 2021-11-06 DIAGNOSIS — H539 Unspecified visual disturbance: Secondary | ICD-10-CM

## 2021-11-06 DIAGNOSIS — K611 Rectal abscess: Secondary | ICD-10-CM | POA: Insufficient documentation

## 2021-11-06 MED ORDER — FLUTICASONE PROPIONATE 50 MCG/ACT NA SUSP: NASAL | 11 refills | Status: DC

## 2021-11-06 MED ORDER — AZITHROMYCIN 500 MG PO TABS: 500.0000 mg | ORAL_TABLET | Freq: Every day | ORAL | 0 refills | Status: DC

## 2021-11-06 MED ORDER — FLUTICASONE-SALMETEROL 100-50 MCG/ACT IN AEPB: 1.0000 | INHALATION_SPRAY | Freq: Two times a day (BID) | RESPIRATORY_TRACT | 11 refills | Status: DC

## 2021-11-06 MED ORDER — ALBUTEROL SULFATE HFA 108 (90 BASE) MCG/ACT IN AERS: 2.0000 | INHALATION_SPRAY | RESPIRATORY_TRACT | 1 refills | Status: DC | PRN

## 2021-11-06 MED ORDER — METHYLPREDNISOLONE ACETATE 80 MG/ML IJ SUSP
80.0000 mg | Freq: Once | INTRAMUSCULAR | Status: AC
  Administered 2021-11-06: 80 mg via INTRAMUSCULAR

## 2021-11-06 MED ORDER — EPINEPHRINE 0.3 MG/0.3ML IJ SOAJ: 0.3000 mg | Freq: Once | INTRAMUSCULAR | 1 refills | Status: DC | PRN

## 2021-11-06 NOTE — Patient Instructions (Addendum)
  1. Continue immunotherapy and Auvi-Q  2. Continue Flonase 1-2 sprays each nostril 1-2 times per day depending on disease activity  3. Continue Advair 100 - 1 inhalations 1-2 times per day depending on disease activity  4. If needed:  A. Antihistamine  B. ProAir HFA   5. Treat reflux / LPR:   A.  Lansoprazole 30 mg -1 tablet in AM  6. For this recent airway / ear issue:   A. Azithromycin 500 mg - 1 tablet 1 time per day for 3 days  B. Depomedrol 80 IM delivered in clinic today  C. Further evaluation and treatment?  7. Visit with eye doctor to examine retina / optic nerve  8. Return to clinic in 1 year or earlier if problem  9. Plan for fall flu vaccine

## 2021-11-06 NOTE — Progress Notes (Unsigned)
[place] [facility] [place] - [place] - [place] [name]   Follow-up Note  Referring Provider: Deatra James, MD Primary Provider: Deatra James, MD Date of Office Visit: 11/06/2021  Subjective:   David May (DOB: [birth year]) is a 41 y.o. male who [facility] in re-evaluation of the following:  HPI: Ryota returns to this clinic in evaluation of asthma and allergic rhinoconjunctivitis and LPR.  His last visit to this clinic was 28 November 2020.  His allergies are really under very good control at this point in time while utilizing a course of immunotherapy and intermittently using nasal steroids and inhaled controller agents.  Rarely does he need to use a short acting bronchodilator and he can exert himself without any problem and he has not required a systemic steroid or it an antibiotic for an airway issue.  However, about 2 months ago he did develop pressure in his ear and face and a whooshing noise in his ears and some sinus headache and some postnasal drip and slight cough along with some positional dizziness.  He was apparently given amoxicillin by his primary care doctor and then he used a very short course of systemic steroids which did help somewhat but he still continues to have these issues.  In addition, he has had this picks elation vision in the lower section of his vision that has also been in existence for the past several weeks.  He has had history of pseudotumor cerebri.  He has not visited with his ophthalmologist in over a year.  Allergies as of 11/06/2021       Reactions   Codeine Itching   Hydrocodone-acetaminophen Rash        Medication List    albuterol 108 (90 Base) MCG/ACT inhaler Commonly known as: VENTOLIN HFA Inhale 2 puffs into the lungs every 4 (four) hours as needed for wheezing or shortness of breath.   cetirizine 10 MG tablet Commonly known as: ZYRTEC Take 10 mg by mouth as needed for allergies.    EPINEPHrine 0.3 mg/0.3 mL Soaj injection Commonly known as: EpiPen 2-Pak Inject 0.3 mg into the muscle once as needed for up to 1 dose for anaphylaxis.   fluticasone 50 MCG/ACT nasal spray Commonly known as: FLONASE SPRAY 2 SPRAYS INTO EACH NOSTRIL EVERY DAY   fluticasone-salmeterol 100-50 MCG/ACT Aepb Commonly known as: Advair Diskus Inhale 1 puff into the lungs 2 (two) times daily.   lansoprazole 30 MG capsule Commonly known as: PREVACID Take 30 mg by mouth daily.    Past Medical History:  Diagnosis Date   Asthma    Pseudotumor cerebri     No past surgical history on file.  Review of systems negative except as noted in HPI / PMHx or noted below:  Review of Systems  Constitutional: Negative.   HENT: Negative.    Eyes: Negative.   Respiratory: Negative.    Cardiovascular: Negative.   Gastrointestinal: Negative.   Genitourinary: Negative.   Musculoskeletal: Negative.   Skin: Negative.   Neurological: Negative.   Endo/Heme/Allergies: Negative.   Psychiatric/Behavioral: Negative.       Objective:   Vitals:   11/06/21 0950  BP: 98/62  Pulse: 62  Resp: 16  Temp: 98.1 F (36.7 C)   Height: 5\' 4"  (162.6 cm)  Weight: 181 lb 12.8 oz (82.5 kg)   Physical Exam Constitutional:      Appearance: He is not diaphoretic.  HENT:     Head: Normocephalic.  Right Ear: Tympanic membrane, ear canal and external ear normal.     Left Ear: Tympanic membrane, ear canal and external ear normal.     Nose: Nose normal. No mucosal edema or rhinorrhea.     Mouth/Throat:     Pharynx: Uvula midline. No oropharyngeal exudate.  Eyes:     Conjunctiva/sclera: Conjunctivae normal.  Neck:     Thyroid: No thyromegaly.     Trachea: Trachea normal. No tracheal tenderness or tracheal deviation.  Cardiovascular:     Rate and Rhythm: Normal rate and regular rhythm.     Heart sounds: Normal heart sounds, S1 normal and S2 normal. No murmur heard. Pulmonary:     Effort: No respiratory  distress.     Breath sounds: Normal breath sounds. No stridor. No wheezing or rales.  Lymphadenopathy:     Head:     Right side of head: No tonsillar adenopathy.     Left side of head: No tonsillar adenopathy.     Cervical: No cervical adenopathy.  Skin:    Findings: No erythema or rash.     Nails: There is no clubbing.  Neurological:     Mental Status: He is alert.     Diagnostics: Spirometry was performed.  His FEV1 was 2.82 which was 88% of predicted.  Assessment and Plan:   1. Asthma, moderate persistent, well-controlled   2. Perennial allergic rhinitis   3. Dysfunction of both eustachian tubes   4. Acute pansinusitis, recurrence not specified   5. Vision changes     1. Continue immunotherapy and Auvi-Q  2. Continue Flonase 1-2 sprays each nostril 1-2 times per day depending on disease activity  3. Continue Advair 100 - 1 inhalations 1-2 times per day depending on disease activity  4. If needed:  A. Antihistamine  B. ProAir HFA   5. Treat reflux / LPR:   A.  Lansoprazole 30 mg -1 tablet in AM  6. For this recent airway / ear issue:   A. Azithromycin 500 mg - 1 tablet 1 time per day for 3 days  B. Depomedrol 80 IM delivered in clinic today  C. Further evaluation and treatment?  7. Visit with eye doctor to examine retina / optic nerve  8. Return to clinic in 1 year or earlier if problem  9. Plan for fall flu vaccine   I am going to treat Kelly with a systemic steroid and a broad-spectrum antibiotic assuming that some of his problems may be related to his sinus infection.  Certainly he has some degree of otic dysfunction and has some positional vertigo and this needs to clear out with the therapy noted above or he will require further evaluation with ENT.  And he also appears to have developed some problems with his vision and I have asked him to find an eye doctor for retinal exam especially given the fact that he has a history of pseudotumor cerebri.  His  airway issue appears to be going quite well while utilizing a course of immunotherapy and using Flonase and Advair at a dose that is required for disease activity.  Assuming he does well with this plan I will see him back in this clinic in 1 year or earlier if there is a problem.  Laurette Schimke, MD Allergy / Immunology  [facility]

## 2021-11-07 ENCOUNTER — Encounter: Payer: Self-pay | Admitting: Allergy and Immunology

## 2021-11-12 NOTE — Progress Notes (Signed)
EXP 11/14/22 

## 2021-11-13 DIAGNOSIS — J3089 Other allergic rhinitis: Secondary | ICD-10-CM | POA: Diagnosis not present

## 2021-11-22 ENCOUNTER — Ambulatory Visit (INDEPENDENT_AMBULATORY_CARE_PROVIDER_SITE_OTHER): Payer: BC Managed Care – PPO

## 2021-11-22 DIAGNOSIS — J309 Allergic rhinitis, unspecified: Secondary | ICD-10-CM | POA: Diagnosis not present

## 2021-12-18 ENCOUNTER — Ambulatory Visit: Payer: BC Managed Care – PPO | Admitting: Allergy and Immunology

## 2021-12-20 ENCOUNTER — Ambulatory Visit (INDEPENDENT_AMBULATORY_CARE_PROVIDER_SITE_OTHER): Payer: BC Managed Care – PPO | Admitting: *Deleted

## 2021-12-20 DIAGNOSIS — J309 Allergic rhinitis, unspecified: Secondary | ICD-10-CM | POA: Diagnosis not present

## 2022-01-07 ENCOUNTER — Encounter: Payer: Self-pay | Admitting: Allergy and Immunology

## 2022-01-17 ENCOUNTER — Ambulatory Visit (INDEPENDENT_AMBULATORY_CARE_PROVIDER_SITE_OTHER): Payer: BC Managed Care – PPO

## 2022-01-17 DIAGNOSIS — J309 Allergic rhinitis, unspecified: Secondary | ICD-10-CM | POA: Diagnosis not present

## 2022-01-24 ENCOUNTER — Ambulatory Visit (INDEPENDENT_AMBULATORY_CARE_PROVIDER_SITE_OTHER): Payer: BC Managed Care – PPO

## 2022-01-24 DIAGNOSIS — J309 Allergic rhinitis, unspecified: Secondary | ICD-10-CM

## 2022-01-31 ENCOUNTER — Ambulatory Visit (INDEPENDENT_AMBULATORY_CARE_PROVIDER_SITE_OTHER): Payer: BC Managed Care – PPO | Admitting: *Deleted

## 2022-01-31 DIAGNOSIS — J309 Allergic rhinitis, unspecified: Secondary | ICD-10-CM | POA: Diagnosis not present

## 2022-02-07 ENCOUNTER — Ambulatory Visit (INDEPENDENT_AMBULATORY_CARE_PROVIDER_SITE_OTHER): Payer: BC Managed Care – PPO

## 2022-02-07 DIAGNOSIS — J309 Allergic rhinitis, unspecified: Secondary | ICD-10-CM | POA: Diagnosis not present

## 2022-02-14 ENCOUNTER — Ambulatory Visit (INDEPENDENT_AMBULATORY_CARE_PROVIDER_SITE_OTHER): Payer: BC Managed Care – PPO

## 2022-02-14 DIAGNOSIS — J309 Allergic rhinitis, unspecified: Secondary | ICD-10-CM

## 2022-03-21 ENCOUNTER — Ambulatory Visit (INDEPENDENT_AMBULATORY_CARE_PROVIDER_SITE_OTHER): Payer: BC Managed Care – PPO

## 2022-03-21 DIAGNOSIS — J309 Allergic rhinitis, unspecified: Secondary | ICD-10-CM | POA: Diagnosis not present

## 2022-04-25 ENCOUNTER — Ambulatory Visit (INDEPENDENT_AMBULATORY_CARE_PROVIDER_SITE_OTHER): Payer: BC Managed Care – PPO

## 2022-04-25 DIAGNOSIS — J309 Allergic rhinitis, unspecified: Secondary | ICD-10-CM

## 2022-05-23 ENCOUNTER — Ambulatory Visit (INDEPENDENT_AMBULATORY_CARE_PROVIDER_SITE_OTHER): Payer: BC Managed Care – PPO

## 2022-05-23 DIAGNOSIS — J309 Allergic rhinitis, unspecified: Secondary | ICD-10-CM

## 2022-06-20 ENCOUNTER — Ambulatory Visit (INDEPENDENT_AMBULATORY_CARE_PROVIDER_SITE_OTHER): Payer: BC Managed Care – PPO

## 2022-06-20 DIAGNOSIS — J309 Allergic rhinitis, unspecified: Secondary | ICD-10-CM

## 2022-07-25 ENCOUNTER — Ambulatory Visit (INDEPENDENT_AMBULATORY_CARE_PROVIDER_SITE_OTHER): Payer: BC Managed Care – PPO

## 2022-07-25 DIAGNOSIS — J309 Allergic rhinitis, unspecified: Secondary | ICD-10-CM | POA: Diagnosis not present

## 2022-07-30 DIAGNOSIS — J3089 Other allergic rhinitis: Secondary | ICD-10-CM | POA: Diagnosis not present

## 2022-07-30 NOTE — Progress Notes (Signed)
VIAL EXP 07-30-23 

## 2022-08-26 ENCOUNTER — Ambulatory Visit (INDEPENDENT_AMBULATORY_CARE_PROVIDER_SITE_OTHER): Payer: BC Managed Care – PPO | Admitting: *Deleted

## 2022-08-26 DIAGNOSIS — J309 Allergic rhinitis, unspecified: Secondary | ICD-10-CM | POA: Diagnosis not present

## 2022-09-26 ENCOUNTER — Ambulatory Visit (INDEPENDENT_AMBULATORY_CARE_PROVIDER_SITE_OTHER): Payer: BC Managed Care – PPO

## 2022-09-26 DIAGNOSIS — J309 Allergic rhinitis, unspecified: Secondary | ICD-10-CM | POA: Diagnosis not present

## 2022-10-03 ENCOUNTER — Ambulatory Visit (INDEPENDENT_AMBULATORY_CARE_PROVIDER_SITE_OTHER): Payer: BC Managed Care – PPO | Admitting: *Deleted

## 2022-10-03 DIAGNOSIS — J309 Allergic rhinitis, unspecified: Secondary | ICD-10-CM | POA: Diagnosis not present

## 2022-10-10 ENCOUNTER — Ambulatory Visit (INDEPENDENT_AMBULATORY_CARE_PROVIDER_SITE_OTHER): Payer: BC Managed Care – PPO

## 2022-10-10 DIAGNOSIS — J309 Allergic rhinitis, unspecified: Secondary | ICD-10-CM | POA: Diagnosis not present

## 2022-10-16 ENCOUNTER — Ambulatory Visit (INDEPENDENT_AMBULATORY_CARE_PROVIDER_SITE_OTHER): Payer: BC Managed Care – PPO | Admitting: *Deleted

## 2022-10-16 DIAGNOSIS — J309 Allergic rhinitis, unspecified: Secondary | ICD-10-CM

## 2022-10-24 ENCOUNTER — Ambulatory Visit (INDEPENDENT_AMBULATORY_CARE_PROVIDER_SITE_OTHER): Payer: BC Managed Care – PPO | Admitting: *Deleted

## 2022-10-24 DIAGNOSIS — J309 Allergic rhinitis, unspecified: Secondary | ICD-10-CM | POA: Diagnosis not present

## 2022-12-05 ENCOUNTER — Ambulatory Visit (INDEPENDENT_AMBULATORY_CARE_PROVIDER_SITE_OTHER): Payer: BC Managed Care – PPO

## 2022-12-05 DIAGNOSIS — J309 Allergic rhinitis, unspecified: Secondary | ICD-10-CM | POA: Diagnosis not present

## 2023-01-02 ENCOUNTER — Ambulatory Visit (INDEPENDENT_AMBULATORY_CARE_PROVIDER_SITE_OTHER): Payer: BC Managed Care – PPO

## 2023-01-02 DIAGNOSIS — J309 Allergic rhinitis, unspecified: Secondary | ICD-10-CM

## 2023-02-04 ENCOUNTER — Ambulatory Visit: Payer: BC Managed Care – PPO | Admitting: Allergy and Immunology

## 2023-02-04 ENCOUNTER — Other Ambulatory Visit: Payer: Self-pay

## 2023-02-04 VITALS — BP 130/86 | HR 70 | Temp 98.3°F | Resp 18 | Ht 64.0 in | Wt 178.7 lb

## 2023-02-04 DIAGNOSIS — J4540 Moderate persistent asthma, uncomplicated: Secondary | ICD-10-CM | POA: Diagnosis not present

## 2023-02-04 DIAGNOSIS — J3089 Other allergic rhinitis: Secondary | ICD-10-CM

## 2023-02-04 DIAGNOSIS — J309 Allergic rhinitis, unspecified: Secondary | ICD-10-CM

## 2023-02-04 DIAGNOSIS — K219 Gastro-esophageal reflux disease without esophagitis: Secondary | ICD-10-CM

## 2023-02-04 MED ORDER — AIRSUPRA 90-80 MCG/ACT IN AERO: 2.0000 | INHALATION_SPRAY | Freq: Four times a day (QID) | RESPIRATORY_TRACT | 2 refills | Status: DC | PRN

## 2023-02-04 MED ORDER — LANSOPRAZOLE 30 MG PO CPDR: 30.0000 mg | DELAYED_RELEASE_CAPSULE | Freq: Every morning | ORAL | 3 refills | Status: DC

## 2023-02-04 MED ORDER — FLUTICASONE PROPIONATE 50 MCG/ACT NA SUSP: NASAL | 5 refills | Status: AC

## 2023-02-04 MED ORDER — EPINEPHRINE 0.3 MG/0.3ML IJ SOAJ: 0.3000 mg | Freq: Once | INTRAMUSCULAR | 1 refills | Status: AC | PRN

## 2023-02-04 NOTE — Progress Notes (Unsigned)
[place] - [place] - [place] - [place] [name]   Follow-up Note  Referring Provider: Deatra James, MD  Primary Provider: Deatra James, MD Date of Office Visit: 02/04/2023  Subjective:   David May (DOB: 25-Feb-1981) is a 42 y.o. male who [facility] on 02/04/2023 in re-evaluation of the following:  HPI: David May returns to this clinic in evaluation of asthma, allergic rhinoconjunctivitis, LPR.  I last saw him in this clinic 06 November 2021.  He is doing very well with minimal issues revolving around his respiratory tract while he continues on immunotherapy currently at every 4 weeks without any adverse effect and continues on some dose of nasal steroid.  He has been tapering off his Advair and has not used this agent in close to a year.  Rarely does use the short acting bronchodilator.  He has not required a systemic steroid or an antibiotic for any type of airway issue.  His reflux treatment is working quite well which includes a proton pump inhibitor once a day.  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  This is resolved his vision problems and interestingly also involve the "whooshing" that was in his ear.  Allergies as of 02/04/2023       Reactions   Codeine Itching   Hydrocodone-acetaminophen Rash        Medication List    acetaZOLAMIDE 250 MG tablet Commonly known as: DIAMOX   albuterol 108 (90 Base) MCG/ACT inhaler Commonly known as: VENTOLIN HFA Inhale 2 puffs into the lungs every 4 (four) hours as needed for wheezing or shortness of breath.   azithromycin 500 MG tablet Commonly known as: ZITHROMAX Take 1 tablet (500 mg  total) by mouth daily.   cetirizine 10 MG tablet Commonly known as: ZYRTEC Take 10 mg by mouth as needed for allergies.   EPINEPHrine 0.3 mg/0.3 mL Soaj injection Commonly known as: EpiPen 2-Pak Inject 0.3 mg into the muscle once as needed for up to 1 dose for anaphylaxis.   fluticasone 50 MCG/ACT nasal spray Commonly known as: FLONASE SPRAY 2 SPRAYS INTO EACH NOSTRIL EVERY DAY   fluticasone-salmeterol 100-50 MCG/ACT Aepb Commonly known as: Advair Diskus Inhale 1 puff into the lungs 2 (two) times daily.   lansoprazole 30 MG capsule Commonly known as: PREVACID Take 30 mg by mouth daily.   sertraline 100 MG tablet Commonly known as: ZOLOFT Take 100 mg by mouth daily.    Past Medical History:  Diagnosis Date   Asthma    Pseudotumor cerebri     No past surgical history on file.  Review of systems negative except as noted in HPI / PMHx or noted below:  Review of Systems  Constitutional: Negative.   HENT: Negative.    Eyes: Negative.   Respiratory: Negative.    Cardiovascular: Negative.   Gastrointestinal: Negative.   Genitourinary: Negative.   Musculoskeletal: Negative.   Skin: Negative.   Neurological: Negative.   Endo/Heme/Allergies: Negative.   Psychiatric/Behavioral: Negative.       Objective:   Vitals:   02/04/23 1523  BP: 130/86  Pulse: 70  Resp: 18  Temp: 98.3 F (36.8  C)  SpO2: 96%   Height: 5\' 4"  (162.6 cm)  Weight: 178 lb 11.2 oz (81.1 kg)   Physical Exam Constitutional:      Appearance: He is not diaphoretic.  HENT:     Head: Normocephalic.     Right Ear: Tympanic membrane, ear canal and external ear normal.     Left Ear: Tympanic membrane, ear canal and external ear normal.     Nose: Nose normal. No mucosal edema or rhinorrhea.     Mouth/Throat:     Pharynx: Uvula midline. No oropharyngeal exudate.  Eyes:     Conjunctiva/sclera: Conjunctivae normal.  Neck:     Thyroid: No thyromegaly.     Trachea: Trachea normal. No tracheal  tenderness or tracheal deviation.  Cardiovascular:     Rate and Rhythm: Normal rate and regular rhythm.     Heart sounds: Normal heart sounds, S1 normal and S2 normal. No murmur heard. Pulmonary:     Effort: No respiratory distress.     Breath sounds: Normal breath sounds. No stridor. No wheezing or rales.  Lymphadenopathy:     Head:     Right side of head: No tonsillar adenopathy.     Left side of head: No tonsillar adenopathy.     Cervical: No cervical adenopathy.  Skin:    Findings: No erythema or rash.     Nails: There is no clubbing.  Neurological:     Mental Status: He is alert.     Diagnostics: Spirometry was performed and demonstrated an FEV1 of 3.32 at 97.65 % of predicted.  Assessment and Plan:   1. Asthma, moderate persistent, well-controlled   2. Perennial allergic rhinitis   3. LPRD (laryngopharyngeal reflux disease)   4. Allergic rhinitis, unspecified seasonality, unspecified trigger     1. Continue immunotherapy and Auvi-Q  2. Continue Flonase 1-2 sprays each nostril 1-2 times per day depending on disease activity  3. If needed:  A. Antihistamine  B. AIRSUPRA - 2 inhalations every 6 hours (Coupon)  5. Treat reflux / LPR:   A.  Lansoprazole 30 mg -1 tablet in AM  6. Return to clinic in 1 year or earlier if problem  7. Plan for fall flu vaccine   David May appears to be doing quite well and immunotherapy has resulted in pretty significant improvement regarding his multiorgan atopic disease and now he just uses Flonase and has eliminated the use of all of his other medications tied up with atopic disease.  I have given him an anti-inflammatory rescue medicine to be used as needed.  He will continue on his lansoprazole for his reflux which is working quite well.  I will see him back in this clinic in 1 year or earlier if there is a problem.   Laurette Schimke, MD Allergy / Immunology [facility]

## 2023-02-04 NOTE — Patient Instructions (Addendum)
  1. Continue immunotherapy and Auvi-Q  2. Continue Flonase 1-2 sprays each nostril 1-2 times per day depending on disease activity  3. If needed:  A. Antihistamine  B. AIRSUPRA - 2 inhalations every 6 hours (Coupon)  5. Treat reflux / LPR:   A.  Lansoprazole 30 mg -1 tablet in AM  6. Return to clinic in 1 year or earlier if problem  7. Plan for fall flu vaccine

## 2023-02-05 ENCOUNTER — Encounter: Payer: Self-pay | Admitting: Allergy and Immunology

## 2023-02-20 ENCOUNTER — Encounter: Payer: Self-pay | Admitting: Neurology

## 2023-03-07 ENCOUNTER — Ambulatory Visit (INDEPENDENT_AMBULATORY_CARE_PROVIDER_SITE_OTHER): Payer: BC Managed Care – PPO

## 2023-03-07 DIAGNOSIS — J309 Allergic rhinitis, unspecified: Secondary | ICD-10-CM | POA: Diagnosis not present

## 2023-03-21 NOTE — Progress Notes (Signed)
Initial neurology clinic note  Brewster Kuzara MRN: [id number] DOB: [birth year]  Referring provider: Frazier, [place], OD  Primary care provider: Deatra James, MD  Reason for consult:  headaches  Subjective:  This is Mr. David May, a 42 y.o. right-handed male with a medical history of IIH, asthma, depression/anxiety, nephrolithiasis who presents to neurology clinic with IIH. The patient is alone today.  Patient has had IIH diagnosis since 2005. It started with visual problems, having difficulty distinguishing colors and patterns and pulsing visions. He does not remember having headaches at that time. Patient was seen by ophthalmology who saw swelling of optic discs. He was sent to ED [facility], [place]). MRI brain was normal. LP showed elevated pressure (but patient does not remember the number). Patient was started on diamox and told to loose weight. After starting diamox, his vision improved. He did get kidney stones after (2 total), none since 2010. He never really had headaches other than occasional sinus headaches.  Patient had a repeat LP in [place], [place] in 2006 which also saw elevated pressure, but not as high per patient report. He has had periods where he stopped Diamox, but the pressure would return. He was most recently taken Diamox around 3 years ago. Patient then saw Venita Lick (most recently on 02/18/23) and blurred optic nerve borders (OD > OS) and OCT with thicken margins. Patient had some "whooshing in ears" but otherwise was asymptomatic (no headaches or vision changes). Patient did have one episode of a "flashing C" mostly in his right eye that lasted a few hours with no associated headache. Patient thought he was dehydrated that day.  Patient was put back on diamox 250 mg BID around August 2024. He has no significant side effects. Of note, patient is also on Zoloft for anxiety and depression. He is a non-smoker.  Patient has a family history of Alzheimers disease (father), and  grandmother had dementia. Both were diagnosed in 19s or later.  MEDICATIONS:  Outpatient Encounter Medications as of 04/02/2023  Medication Sig   acetaZOLAMIDE (DIAMOX) 250 MG tablet    Albuterol-Budesonide (AIRSUPRA) 90-80 MCG/ACT AERO Inhale 2 puffs into the lungs every 6 (six) hours as needed.   cetirizine (ZYRTEC) 10 MG tablet Take 10 mg by mouth as needed for allergies.   EPINEPHrine (EPIPEN 2-PAK) 0.3 mg/0.3 mL IJ SOAJ injection Inject 0.3 mg into the muscle once as needed for up to 1 dose for anaphylaxis.   fluticasone (FLONASE) 50 MCG/ACT nasal spray 1-2 sprays each nostril 1-2 times per day depending on disease activity   lansoprazole (PREVACID) 30 MG capsule Take 1 capsule (30 mg total) by mouth every morning.   sertraline (ZOLOFT) 100 MG tablet Take 100 mg by mouth daily.   albuterol (VENTOLIN HFA) 108 (90 Base) MCG/ACT inhaler Inhale 2 puffs into the lungs every 4 (four) hours as needed for wheezing or shortness of breath. (Patient not taking: Reported on 04/02/2023)   azithromycin (ZITHROMAX) 500 MG tablet Take 1 tablet (500 mg total) by mouth daily. (Patient not taking: Reported on 04/02/2023)   fluticasone-salmeterol (ADVAIR DISKUS) 100-50 MCG/ACT AEPB Inhale 1 puff into the lungs 2 (two) times daily. (Patient not taking: Reported on 04/02/2023)   No facility-administered encounter medications on file as of 04/02/2023.    PAST MEDICAL HISTORY: Past Medical History:  Diagnosis Date   Anxiety    Asthma    Depression    Pseudotumor cerebri     PAST SURGICAL HISTORY: History reviewed. No pertinent surgical history.  ALLERGIES: Allergies  Allergen Reactions   Codeine Itching   Hydrocodone-Acetaminophen Rash    FAMILY HISTORY: Family History  Problem Relation Age of Onset   Neuropathy Mother    Asthma Mother    Alzheimer's disease Father     SOCIAL HISTORY: Social History   Tobacco Use   Smoking status: Never   Smokeless tobacco: Never  Vaping Use    Vaping status: Never Used  Substance Use Topics   Alcohol use: Not Currently   Drug use: No   Social History   Social History Narrative   Are you right handed or left handed? Right   Are you currently employed ?    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    What type of home do you live in: 1 story or 2 story? one    Caffiene none    Objective:  Vital Signs:  BP 111/69   Pulse 66   Ht 5\' 4"  (1.626 m)   Wt 183 lb (83 kg)   SpO2 97%   BMI 31.41 kg/m   General: No acute distress.  Patient appears well-groomed.   Head:  Normocephalic/atraumatic Eyes:  fundi examined, ?blurred margins temporally Neck: supple, no paraspinal tenderness, full range of motion Lungs: Non-labored breathing on room air   Neurological Exam: Mental status: alert and oriented, speech fluent and not dysarthric, language intact.  Cranial nerves: CN I: not tested CN II: pupils equal, round and reactive to light, visual fields intact CN III, IV, VI:  full range of motion, no nystagmus, no ptosis CN V: facial sensation intact. CN VII: upper and lower face symmetric CN VIII: hearing intact CN IX, X: uvula midline CN XI: sternocleidomastoid and trapezius muscles intact CN XII: tongue midline  Bulk & Tone: normal, no fasciculations. Motor:  muscle strength 5/5 throughout Deep Tendon Reflexes:  2+ throughout.   Sensation:  Light touch sensation intact. Finger to nose testing:  Without dysmetria.   Gait:  Normal station and stride.    Labs and Imaging review: Imaging: Lumbar spine xray (08/17/20): FINDINGS: Five lumbar type vertebral bodies are well visualized. Vertebral body height is well maintained. No anterolisthesis is noted. Previously seen density in the anterior aspect of the L1 vertebral body is not well appreciated on this exam. No other focal abnormality is seen.   IMPRESSION: No acute abnormality noted. Previously seen  sclerotic areas not well appreciated.  Assessment/Plan:  David May is a 42 y.o. male who presents for idiopathic intracranial hypertension (IIH). He has a relevant medical history of IIH, asthma, depression/anxiety, nephrolithiasis. His neurological examination is pertinent for possible mild blurring of the optic disc margins, consistent with papilledema. Per patient report, LP in 2005 and 2006 showed elevated pressure. MRI brain in 2005 was normal per reports. Patient has never had significant headaches, but occasionally will have vision changes. He is currently asymptomatic on Diamox 250 mg BID and followed by [facility].  PLAN: -Continue Diamox 250 mg BID -Patient will monitor for headaches or vision changes and call with changes in symptoms  -Return to clinic in 6 months  The impression above as well as the plan as outlined below were extensively discussed with the patient who voiced understanding. All questions were answered to their satisfaction.  When available, results of the above investigations and possible further recommendations will be communicated to the patient via telephone/MyChart. Patient to call office if not contacted  after expected testing turnaround time.   Total time spent reviewing records, interview, history/exam, documentation, and coordination of care on day of encounter:  50 min   Thank you for allowing me to participate in patient's care.  If I can answer any additional questions, I would be pleased to do so.  Jacquelyne Balint, MD   CC: Deatra James, MD [phone] [place] [address]  CC: Referring provider: Frazier, [place], OD [address] [place],  [place] [address]

## 2023-04-02 ENCOUNTER — Ambulatory Visit (INDEPENDENT_AMBULATORY_CARE_PROVIDER_SITE_OTHER): Payer: Self-pay | Admitting: *Deleted

## 2023-04-02 ENCOUNTER — Encounter: Payer: Self-pay | Admitting: Neurology

## 2023-04-02 ENCOUNTER — Ambulatory Visit: Payer: BC Managed Care – PPO | Admitting: Neurology

## 2023-04-02 VITALS — BP 111/69 | HR 66 | Ht 64.0 in | Wt 183.0 lb

## 2023-04-02 DIAGNOSIS — H539 Unspecified visual disturbance: Secondary | ICD-10-CM

## 2023-04-02 DIAGNOSIS — J309 Allergic rhinitis, unspecified: Secondary | ICD-10-CM

## 2023-04-02 DIAGNOSIS — G932 Benign intracranial hypertension: Secondary | ICD-10-CM

## 2023-04-02 NOTE — Patient Instructions (Signed)
I saw you today for IIH. It seems like your symptoms are under good control with Diamox 250 mg twice daily. I recommend continuing this.  If you have headaches or vision changes or side effects to the medication, please let me know.  I will see you back in clinic in 6 months to re-evaluate. Please let me know if you have any questions or concerns in the meantime.   The physicians and staff at [facility] are committed to providing excellent care. You may receive a survey requesting feedback about your experience at our office. We strive to receive "very good" responses to the survey questions. If you feel that your experience would prevent you from giving the office a "very good " response, please contact our office to try to remedy the situation. We may be reached at [phone]. Thank you for taking the time out of your busy day to complete the survey.  Jacquelyne Balint, MD [facility]

## 2023-04-17 DIAGNOSIS — J3089 Other allergic rhinitis: Secondary | ICD-10-CM | POA: Diagnosis not present

## 2023-04-17 NOTE — Progress Notes (Signed)
 VIAL EXP 04-16-24

## 2023-05-06 ENCOUNTER — Ambulatory Visit (INDEPENDENT_AMBULATORY_CARE_PROVIDER_SITE_OTHER): Payer: Self-pay | Admitting: *Deleted

## 2023-05-06 DIAGNOSIS — J309 Allergic rhinitis, unspecified: Secondary | ICD-10-CM

## 2023-06-03 ENCOUNTER — Ambulatory Visit (INDEPENDENT_AMBULATORY_CARE_PROVIDER_SITE_OTHER): Payer: Self-pay | Admitting: *Deleted

## 2023-06-03 DIAGNOSIS — J309 Allergic rhinitis, unspecified: Secondary | ICD-10-CM

## 2023-06-12 ENCOUNTER — Ambulatory Visit (INDEPENDENT_AMBULATORY_CARE_PROVIDER_SITE_OTHER): Payer: Self-pay

## 2023-06-12 DIAGNOSIS — J309 Allergic rhinitis, unspecified: Secondary | ICD-10-CM | POA: Diagnosis not present

## 2023-06-18 ENCOUNTER — Ambulatory Visit (INDEPENDENT_AMBULATORY_CARE_PROVIDER_SITE_OTHER): Payer: Self-pay | Admitting: *Deleted

## 2023-06-18 DIAGNOSIS — J309 Allergic rhinitis, unspecified: Secondary | ICD-10-CM

## 2023-07-17 ENCOUNTER — Ambulatory Visit (INDEPENDENT_AMBULATORY_CARE_PROVIDER_SITE_OTHER): Payer: Self-pay

## 2023-07-17 DIAGNOSIS — J309 Allergic rhinitis, unspecified: Secondary | ICD-10-CM

## 2023-08-14 ENCOUNTER — Ambulatory Visit (INDEPENDENT_AMBULATORY_CARE_PROVIDER_SITE_OTHER): Payer: Self-pay

## 2023-08-14 DIAGNOSIS — J309 Allergic rhinitis, unspecified: Secondary | ICD-10-CM

## 2023-09-09 ENCOUNTER — Ambulatory Visit (INDEPENDENT_AMBULATORY_CARE_PROVIDER_SITE_OTHER): Payer: Self-pay

## 2023-09-09 DIAGNOSIS — J309 Allergic rhinitis, unspecified: Secondary | ICD-10-CM

## 2023-09-22 NOTE — Progress Notes (Signed)
 NEUROLOGY FOLLOW UP OFFICE NOTE  David May [id number]  Subjective:  David May is a 43 y.o. year old right-handed male with a medical history of IIH, asthma, depression/anxiety, nephrolithiasis who we last saw on 04/02/23 for IIH.  To briefly review: 04/02/23: Patient has had IIH diagnosis since 2005. It started with visual problems, having difficulty distinguishing colors and patterns and pulsing visions. He does not remember having headaches at that time. Patient was seen by ophthalmology who saw swelling of optic discs. He was sent to ED [facility], [place]). MRI brain was normal. LP showed elevated pressure (but patient does not remember the number). Patient was started on diamox and told to loose weight. After starting diamox, his vision improved. He did get kidney stones after (2 total), none since 2010. He never really had headaches other than occasional sinus headaches.   Patient had a repeat LP in [place], [place] in 2006 which also saw elevated pressure, but not as high per patient report. He has had periods where he stopped Diamox, but the pressure would return. He was most recently taken Diamox around 3 years ago. Patient then saw Dorean Gambles (most recently on 02/18/23) and blurred optic nerve borders (OD > OS) and OCT with thicken margins. Patient had some whooshing in ears but otherwise was asymptomatic (no headaches or vision changes). Patient did have one episode of a flashing C mostly in his right eye that lasted a few hours with no associated headache. Patient thought he was dehydrated that day.   Patient was put back on diamox 250 mg BID around August 2024. He has no significant side effects. Of note, patient is also on Zoloft for anxiety and depression. He is a non-smoker.   Patient has a family history of Alzheimers disease (father), and grandmother had dementia. Both were diagnosed in 42s or later.  Most recent Assessment and Plan (04/02/23): David May is a 43  y.o. male who presents for idiopathic intracranial hypertension (IIH). He has a relevant medical history of IIH, asthma, depression/anxiety, nephrolithiasis. His neurological examination is pertinent for possible mild blurring of the optic disc margins, consistent with papilledema. Per patient report, LP in 2005 and 2006 showed elevated pressure. MRI brain in 2005 was normal per reports. Patient has never had significant headaches, but occasionally will have vision changes. He is currently asymptomatic on Diamox 250 mg BID and followed by [facility].   PLAN: -Continue Diamox 250 mg BID -Patient will monitor for headaches or vision changes and call with changes in symptoms  Since their last visit: Patient is doing well. He has very rare headaches. He has had no vision changes. He is followed by [facility], about every 6 months. He has been in 2025 (?05/2023) and was told the swelling looked better. He is still on diamox 250 mg BID and having no side effects. He has a yearly physical upcoming where he will be getting labs.  He has no new symptoms.  MEDICATIONS:  Outpatient Encounter Medications as of 10/01/2023  Medication Sig   acetaZOLAMIDE (DIAMOX) 250 MG tablet  (Patient taking differently: Take 250 mg by mouth 2 (two) times daily.)   Albuterol -Budesonide (AIRSUPRA ) 90-80 MCG/ACT AERO Inhale 2 puffs into the lungs every 6 (six) hours as needed.   cetirizine (ZYRTEC) 10 MG tablet Take 10 mg by mouth as needed for allergies.   EPINEPHrine  (EPIPEN  2-PAK) 0.3 mg/0.3 mL IJ SOAJ injection Inject 0.3 mg into the muscle once as needed for up to 1 dose for  anaphylaxis.   fluticasone  (FLONASE ) 50 MCG/ACT nasal spray 1-2 sprays each nostril 1-2 times per day depending on disease activity   lansoprazole  (PREVACID ) 30 MG capsule Take 1 capsule (30 mg total) by mouth every morning.   Lysine HCl 500 MG TABS Take 1 tablet by mouth daily.   sertraline (ZOLOFT) 100 MG tablet Take 100 mg by mouth daily.    acyclovir (ZOVIRAX) 200 MG capsule Take 200 mg by mouth 5 (five) times daily. As needed (Patient not taking: Reported on 10/01/2023)   albuterol  (VENTOLIN  HFA) 108 (90 Base) MCG/ACT inhaler Inhale 2 puffs into the lungs every 4 (four) hours as needed for wheezing or shortness of breath. (Patient not taking: Reported on 04/02/2023)   azithromycin  (ZITHROMAX ) 500 MG tablet Take 1 tablet (500 mg total) by mouth daily. (Patient not taking: Reported on 04/02/2023)   fluticasone -salmeterol (ADVAIR DISKUS) 100-50 MCG/ACT AEPB Inhale 1 puff into the lungs 2 (two) times daily. (Patient not taking: Reported on 04/02/2023)   No facility-administered encounter medications on file as of 10/01/2023.    PAST MEDICAL HISTORY: Past Medical History:  Diagnosis Date   Anxiety    Asthma    Depression    Pseudotumor cerebri     PAST SURGICAL HISTORY: History reviewed. No pertinent surgical history.  ALLERGIES: Allergies  Allergen Reactions   Codeine Itching   Hydrocodone-Acetaminophen Rash    FAMILY HISTORY: Family History  Problem Relation Age of Onset   Neuropathy Mother    Asthma Mother    Alzheimer's disease Father    Prostate cancer Father     SOCIAL HISTORY: Social History   Tobacco Use   Smoking status: Never   Smokeless tobacco: Never  Vaping Use   Vaping status: Never Used  Substance Use Topics   Alcohol use: Not Currently   Drug use: No   Social History   Social History Narrative   Are you right handed or left handed? Right   Are you currently employed ?    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    What type of home do you live in: 1 story or 2 story? one    Caffiene none      Objective:  Vital Signs:  BP 113/71   Pulse (!) 58   Ht 5' 4 (1.626 m)   Wt 181 lb (82.1 kg)   SpO2 97%   BMI 31.07 kg/m   General: No acute distress.  Patient appears well-groomed.   Head:  Normocephalic/atraumatic Eyes:   Fundi examined, disc margins appear clear with no obvious papilledema on my non-dilated fundoscopy Neck: supple Lungs:  Non-labored breathing on room air  Neurological Exam: alert and oriented.  Speech fluent and not dysarthric, language intact.  CN II-XII intact. Bulk and tone normal, muscle strength 5/5 throughout.  Sensation to light touch intact.  Deep tendon reflexes 2+ throughout.  Finger to nose testing intact.  Gait normal.  Labs and Imaging review: No new results  Previously reviewed results: Lumbar spine xray (08/17/20): FINDINGS: Five lumbar type vertebral bodies are well visualized. Vertebral body height is well maintained. No anterolisthesis is noted. Previously seen density in the anterior aspect of the L1 vertebral body is not well appreciated on this exam. No other focal abnormality is seen.   IMPRESSION: No acute abnormality noted. Previously seen sclerotic areas not well appreciated.  Assessment/Plan:  This is David May, a 43 y.o. male  with IIH. He is doing very well on diamox 250 mg BID with no significant headaches or vision changes.  Plan: -Continue diamox 250 mg BID -Follow up with [facility] -Call with new or worsening symptoms  Return to clinic in 6 months  Total time spent reviewing records, interview, history/exam, documentation, and coordination of care on day of encounter:  30 min  Rommie Coats, MD

## 2023-10-01 ENCOUNTER — Ambulatory Visit: Payer: BC Managed Care – PPO | Admitting: Neurology

## 2023-10-01 ENCOUNTER — Encounter: Payer: Self-pay | Admitting: Neurology

## 2023-10-01 VITALS — BP 113/71 | HR 58 | Ht 64.0 in | Wt 181.0 lb

## 2023-10-01 DIAGNOSIS — H539 Unspecified visual disturbance: Secondary | ICD-10-CM | POA: Diagnosis not present

## 2023-10-01 DIAGNOSIS — G932 Benign intracranial hypertension: Secondary | ICD-10-CM

## 2023-10-01 NOTE — Patient Instructions (Signed)
 Continue Diamox 250 mg twice daily.  Continue to follow up with [facility].  Please call with new or worsening symptoms.  I will see you again in 6 months.  Please let me know if you have any questions or concerns in the meantime.  The physicians and staff at [facility] are committed to providing excellent care. You may receive a survey requesting feedback about your experience at our office. We strive to receive very good responses to the survey questions. If you feel that your experience would prevent you from giving the office a very good  response, please contact our office to try to remedy the situation. We may be reached at [phone]. Thank you for taking the time out of your busy day to complete the survey.  Rommie Coats, MD [facility]

## 2023-10-14 ENCOUNTER — Ambulatory Visit (INDEPENDENT_AMBULATORY_CARE_PROVIDER_SITE_OTHER)

## 2023-10-14 DIAGNOSIS — J309 Allergic rhinitis, unspecified: Secondary | ICD-10-CM

## 2023-11-13 ENCOUNTER — Ambulatory Visit (INDEPENDENT_AMBULATORY_CARE_PROVIDER_SITE_OTHER)

## 2023-11-13 DIAGNOSIS — J309 Allergic rhinitis, unspecified: Secondary | ICD-10-CM

## 2023-12-11 ENCOUNTER — Ambulatory Visit (INDEPENDENT_AMBULATORY_CARE_PROVIDER_SITE_OTHER)

## 2023-12-11 DIAGNOSIS — J309 Allergic rhinitis, unspecified: Secondary | ICD-10-CM | POA: Diagnosis not present

## 2024-01-08 ENCOUNTER — Ambulatory Visit (INDEPENDENT_AMBULATORY_CARE_PROVIDER_SITE_OTHER)

## 2024-01-08 DIAGNOSIS — J309 Allergic rhinitis, unspecified: Secondary | ICD-10-CM

## 2024-01-21 DIAGNOSIS — J302 Other seasonal allergic rhinitis: Secondary | ICD-10-CM | POA: Diagnosis not present

## 2024-01-21 DIAGNOSIS — J3081 Allergic rhinitis due to animal (cat) (dog) hair and dander: Secondary | ICD-10-CM | POA: Diagnosis not present

## 2024-01-21 DIAGNOSIS — J3089 Other allergic rhinitis: Secondary | ICD-10-CM | POA: Diagnosis not present

## 2024-01-21 NOTE — Progress Notes (Signed)
 VIAL MADE 01-21-24

## 2024-02-03 ENCOUNTER — Encounter: Payer: Self-pay | Admitting: Allergy and Immunology

## 2024-02-03 ENCOUNTER — Ambulatory Visit: Payer: BC Managed Care – PPO | Admitting: Allergy and Immunology

## 2024-02-03 VITALS — BP 118/80 | HR 64 | Ht 64.0 in | Wt 178.0 lb

## 2024-02-03 DIAGNOSIS — J3089 Other allergic rhinitis: Secondary | ICD-10-CM

## 2024-02-03 DIAGNOSIS — J4520 Mild intermittent asthma, uncomplicated: Secondary | ICD-10-CM | POA: Diagnosis not present

## 2024-02-03 DIAGNOSIS — K219 Gastro-esophageal reflux disease without esophagitis: Secondary | ICD-10-CM | POA: Diagnosis not present

## 2024-02-03 DIAGNOSIS — J309 Allergic rhinitis, unspecified: Secondary | ICD-10-CM

## 2024-02-03 MED ORDER — LANSOPRAZOLE 30 MG PO CPDR: 30.0000 mg | DELAYED_RELEASE_CAPSULE | Freq: Every morning | ORAL | 3 refills | Status: AC

## 2024-02-03 MED ORDER — AIRSUPRA 90-80 MCG/ACT IN AERO: 2.0000 | INHALATION_SPRAY | Freq: Four times a day (QID) | RESPIRATORY_TRACT | 2 refills | Status: AC | PRN

## 2024-02-03 MED ORDER — CETIRIZINE HCL 10 MG PO TABS: 10.0000 mg | ORAL_TABLET | ORAL | 5 refills | Status: AC | PRN

## 2024-02-03 NOTE — Progress Notes (Unsigned)
 David May - [place] [facility] [place] [address] [facility]   Follow-up Note  Referring Provider: Sun, Vyvyan, MD Primary Provider: Sun, Vyvyan, MD Date of Office Visit: 02/03/2024  Subjective:   David May (DOB: February 15, 1981) is a 43 y.o. male who [facility] in re-evaluation of the following:  HPI: David May returns to this clinic in evaluation of asthma, allergic rhinoconjunctivitis, LPR.  I last saw him in this clinic 04 February 2023.  He continues on immunotherapy every 4 weeks which is working quite well and preventing him from developing significant problems with his atopic disease and he also continues to use Flonase  on occasion.  It does not sound as though he is required a systemic steroid or antibiotic for any type of airway issue.  Asthma has been virtually nonexistent.  And he rarely uses any rescue inhaler.  Reflux has been okay while using lansoprazole  once a day.  His mom passed away several weeks ago and he has been eating poorly and has had a little bit more problem with his reflux.  Allergies as of 02/03/2024       Reactions   Codeine Itching   Hydrocodone-acetaminophen Rash        Medication List    acetaZOLAMIDE 250 MG tablet Commonly known as: DIAMOX   acyclovir 200 MG capsule Commonly known as: ZOVIRAX Take 200 mg by mouth 5 (five) times daily. As needed   Airsupra  90-80 MCG/ACT Aero Generic drug: Albuterol -Budesonide Inhale 2 puffs into the lungs every 6 (six) hours as needed.   cetirizine 10 MG tablet Commonly known as: ZYRTEC Take 10 mg by mouth as needed for allergies.   EPINEPHrine  0.3 mg/0.3 mL Soaj injection Commonly known as: EpiPen  2-Pak Inject 0.3 mg into the muscle once as needed for up to 1 dose for anaphylaxis.   fluticasone  50 MCG/ACT nasal spray Commonly known as: FLONASE  1-2 sprays each nostril 1-2 times per day depending on disease activity   lansoprazole  30 MG  capsule Commonly known as: PREVACID  Take 1 capsule (30 mg total) by mouth every morning.   Lysine HCl 500 MG Tabs Take 1 tablet by mouth daily.   sertraline 100 MG tablet Commonly known as: ZOLOFT Take 100 mg by mouth daily.    Past Medical History:  Diagnosis Date   Anxiety    Asthma    Depression    Pseudotumor cerebri     History reviewed. No pertinent surgical history.  Review of systems negative except as noted in HPI / PMHx or noted below:  Review of Systems  Constitutional: Negative.   HENT: Negative.    Eyes: Negative.   Respiratory: Negative.    Cardiovascular: Negative.   Gastrointestinal: Negative.   Genitourinary: Negative.   Musculoskeletal: Negative.   Skin: Negative.   Neurological: Negative.   Endo/Heme/Allergies: Negative.   Psychiatric/Behavioral: Negative.       Objective:   Vitals:   02/03/24 1054  BP: 118/80  Pulse: 64  SpO2: 95%   Height: 5' 4 (162.6 cm)  Weight: 178 lb (80.7 kg)   Physical Exam Constitutional:      Appearance: He is not diaphoretic.  HENT:     Head: Normocephalic.     Right Ear: Tympanic membrane, ear canal and external ear normal.     Left Ear: Tympanic membrane, ear canal and external ear normal.     Nose: Nose normal. No mucosal edema or rhinorrhea.     Mouth/Throat:  Pharynx: Uvula midline. No oropharyngeal exudate.  Eyes:     Conjunctiva/sclera: Conjunctivae normal.  Neck:     Thyroid: No thyromegaly.     Trachea: Trachea normal. No tracheal tenderness or tracheal deviation.  Cardiovascular:     Rate and Rhythm: Normal rate and regular rhythm.     Heart sounds: Normal heart sounds, S1 normal and S2 normal. No murmur heard. Pulmonary:     Effort: No respiratory distress.     Breath sounds: Normal breath sounds. No stridor. No wheezing or rales.  Lymphadenopathy:     Head:     Right side of head: No tonsillar adenopathy.     Left side of head: No tonsillar adenopathy.     Cervical: No cervical  adenopathy.  Skin:    Findings: No erythema or rash.     Nails: There is no clubbing.  Neurological:     Mental Status: He is alert.     Diagnostics: Spirometry was performed and demonstrated an FEV1 of 3.10 at 92 % of predicted.  Assessment and Plan:   1. Asthma, mild intermittent, well-controlled   2. Perennial allergic rhinitis   3. LPRD (laryngopharyngeal reflux disease)   4. Allergic rhinitis, unspecified seasonality, unspecified trigger    1. Continue immunotherapy and Auvi-Q   2. Continue Flonase  1-2 sprays each nostril 1-2 times per day   3. If needed:  A. Antihistamine  B. AIRSUPRA  - 2 inhalations every 6 hours   4. Treat reflux / LPR:   A.  Lansoprazole  30 mg -1 tablet 1-2 times per day  5. Return to clinic in 1 year or earlier if problem  6. Influenza = Tamiflu. Covid = Paxlovid  David May appears to be doing quite well while utilizing immunotherapy and some Flonase  to control his atopic respiratory disease and his reflux appears to be going okay while using lansoprazole  once a day although recently he has had a bit more problems with reflux as he is not eating correctly and he can always go up to twice a day should it be required.  Assuming he does well with the plan noted above I will see him back in this clinic in 1 year or earlier if there is a problem.   Camellia Denis, MD Allergy / Immunology [facility]

## 2024-02-03 NOTE — Patient Instructions (Addendum)
  1. Continue immunotherapy and Auvi-Q   2. Continue Flonase  1-2 sprays each nostril 1-2 times per day   3. If needed:  A. Antihistamine  B. AIRSUPRA  - 2 inhalations every 6 hours   4. Treat reflux / LPR:   A.  Lansoprazole  30 mg -1 tablet 1-2 times per day  5. Return to clinic in 1 year or earlier if problem  6. Influenza = Tamiflu. Covid = Paxlovid

## 2024-02-04 ENCOUNTER — Encounter: Payer: Self-pay | Admitting: Allergy and Immunology

## 2024-02-06 ENCOUNTER — Encounter: Payer: Self-pay | Admitting: Allergy and Immunology

## 2024-02-13 ENCOUNTER — Ambulatory Visit (INDEPENDENT_AMBULATORY_CARE_PROVIDER_SITE_OTHER): Admitting: *Deleted

## 2024-02-13 DIAGNOSIS — J309 Allergic rhinitis, unspecified: Secondary | ICD-10-CM | POA: Diagnosis not present

## 2024-02-19 ENCOUNTER — Ambulatory Visit (INDEPENDENT_AMBULATORY_CARE_PROVIDER_SITE_OTHER)

## 2024-02-19 DIAGNOSIS — J309 Allergic rhinitis, unspecified: Secondary | ICD-10-CM | POA: Diagnosis not present

## 2024-03-18 ENCOUNTER — Ambulatory Visit

## 2024-03-18 DIAGNOSIS — J309 Allergic rhinitis, unspecified: Secondary | ICD-10-CM

## 2024-03-25 NOTE — Progress Notes (Unsigned)
 NEUROLOGY FOLLOW UP OFFICE NOTE  David May [id number]  Subjective:  David May is a 43 y.o. year old right-handed male with a medical history of IIH, asthma, depression/anxiety, nephrolithiasis who we last saw on 10/01/23 for IIH.  To briefly review: 04/02/23: Patient has had IIH diagnosis since 2005. It started with visual problems, having difficulty distinguishing colors and patterns and pulsing visions. He does not remember having headaches at that time. Patient was seen by ophthalmology who saw swelling of optic discs. He was sent to ED [facility], [place]). MRI brain was normal. LP showed elevated pressure (but patient does not remember the number). Patient was started on diamox and told to loose weight. After starting diamox, his vision improved. He did get kidney stones after (2 total), none since 2010. He never really had headaches other than occasional sinus headaches.   Patient had a repeat LP in [place], [place] in 2006 which also saw elevated pressure, but not as high per patient report. He has had periods where he stopped Diamox, but the pressure would return. He was most recently taken Diamox around 3 years ago. Patient then saw David May (most recently on 02/18/23) and blurred optic nerve borders (OD > OS) and OCT with thicken margins. Patient had some whooshing in ears but otherwise was asymptomatic (no headaches or vision changes). Patient did have one episode of a flashing C mostly in his right eye that lasted a few hours with no associated headache. Patient thought he was dehydrated that day.   Patient was put back on diamox 250 mg BID around August 2024. He has no significant side effects. Of note, patient is also on Zoloft for anxiety and depression. He is a non-smoker.   Patient has a family history of Alzheimers disease (father), and grandmother had dementia. Both were diagnosed in 18s or later.  10/01/23: Patient is doing well. He has very rare headaches. He has had  no vision changes. He is followed by David May, about every 6 months. He has been in 2025 (?05/2023) and was told the swelling looked better. He is still on diamox 250 mg BID and having no side effects. He has a yearly physical upcoming where he will be getting labs.  Most recent Assessment and Plan (10/01/23): This is David May, a 43 y.o. male with IIH. He is doing very well on diamox 250 mg BID with no significant headaches or vision changes.   Plan: -Continue diamox 250 mg BID -Follow up with David May -Call with new or worsening symptoms  Since their last visit: ***  Headaches? Vision loss? Current medications? Side effects?  Recent labs?  MEDICATIONS:  Outpatient Encounter Medications as of 04/02/2024  Medication Sig   acetaZOLAMIDE (DIAMOX) 250 MG tablet    acyclovir (ZOVIRAX) 200 MG capsule Take 200 mg by mouth 5 (five) times daily. As needed (Patient not taking: Reported on 10/01/2023)   Albuterol -Budesonide (AIRSUPRA ) 90-80 MCG/ACT AERO Inhale 2 puffs into the lungs every 6 (six) hours as needed.   cetirizine  (ZYRTEC ) 10 MG tablet Take 1 tablet (10 mg total) by mouth as needed for allergies.   EPINEPHrine  (EPIPEN  2-PAK) 0.3 mg/0.3 mL IJ SOAJ injection Inject 0.3 mg into the muscle once as needed for up to 1 dose for anaphylaxis.   fluticasone  (FLONASE ) 50 MCG/ACT nasal spray 1-2 sprays each nostril 1-2 times per day depending on disease activity   lansoprazole  (PREVACID ) 30 MG capsule Take 1 capsule (30 mg total) by mouth every morning.  Lysine HCl 500 MG TABS Take 1 tablet by mouth daily.   sertraline (ZOLOFT) 100 MG tablet Take 100 mg by mouth daily.   No facility-administered encounter medications on file as of 04/02/2024.    PAST MEDICAL HISTORY: Past Medical History:  Diagnosis Date   Anxiety    Asthma    Depression    Pseudotumor cerebri     PAST SURGICAL HISTORY: No past surgical history on file.  ALLERGIES: Allergies[1]  FAMILY  HISTORY: Family History  Problem Relation Age of Onset   Neuropathy Mother    Asthma Mother    Alzheimer's disease Father    Prostate cancer Father     SOCIAL HISTORY: Social History[2] Social History   Social History Narrative   Are you right handed or left handed? Right   Are you currently employed ?    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    What type of home do you live in: 1 story or 2 story? one    Caffiene none      Objective:  Vital Signs:  There were no vitals taken for this visit.  ***  Labs and Imaging review: New results: ***None that I can see  Previously reviewed results: Lumbar spine xray (08/17/20): FINDINGS: Five lumbar type vertebral bodies are well visualized. Vertebral body height is well maintained. No anterolisthesis is noted. Previously seen density in the anterior aspect of the L1 vertebral body is not well appreciated on this exam. No other focal abnormality is seen.   IMPRESSION: No acute abnormality noted. Previously seen sclerotic areas not well appreciated.  Assessment/Plan:  This is David May, a 43 y.o. male with: ***   Plan: ***Need CMP if he has not had one recently to monitor Diamox -Continue Diamox 250 mg BID  Return to clinic in ***1 year vs PRN***  Total time spent reviewing records, interview, history/exam, documentation, and coordination of care on day of encounter:  *** min  Venetia Potters, MD    [1]  Allergies Allergen Reactions   Codeine Itching   Hydrocodone-Acetaminophen Rash  [2]  Social History Tobacco Use   Smoking status: Never   Smokeless tobacco: Never  Vaping Use   Vaping status: Never Used  Substance Use Topics   Alcohol use: Not Currently   Drug use: No

## 2024-04-02 ENCOUNTER — Ambulatory Visit: Admitting: Neurology

## 2024-04-02 ENCOUNTER — Encounter: Payer: Self-pay | Admitting: Neurology

## 2024-04-02 VITALS — BP 98/67 | HR 76 | Ht 64.0 in | Wt 181.0 lb

## 2024-04-02 DIAGNOSIS — G932 Benign intracranial hypertension: Secondary | ICD-10-CM

## 2024-04-02 NOTE — Patient Instructions (Signed)
-  Continue Diamox 250 mg twice daily  -Please call with new or worsening symptoms (headaches or vision changes)  Return to clinic in 1 year  Please let me know if you have any questions or concerns in the meantime.   The physicians and staff at [facility] are committed to providing excellent care. You may receive a survey requesting feedback about your experience at our office. We strive to receive very good responses to the survey questions. If you feel that your experience would prevent you from giving the office a very good  response, please contact our office to try to remedy the situation. We may be reached at [phone]. Thank you for taking the time out of your busy day to complete the survey.  Venetia Potters, MD [facility]

## 2024-04-22 ENCOUNTER — Ambulatory Visit (INDEPENDENT_AMBULATORY_CARE_PROVIDER_SITE_OTHER)

## 2024-04-22 DIAGNOSIS — J302 Other seasonal allergic rhinitis: Secondary | ICD-10-CM | POA: Diagnosis not present

## 2024-05-20 ENCOUNTER — Ambulatory Visit

## 2024-05-20 DIAGNOSIS — J302 Other seasonal allergic rhinitis: Secondary | ICD-10-CM

## 2025-02-01 ENCOUNTER — Ambulatory Visit: Admitting: Allergy and Immunology

## 2025-04-06 ENCOUNTER — Ambulatory Visit: Payer: Self-pay | Admitting: Neurology
